# Patient Record
Sex: Female | Born: 1980
Health system: Southern US, Community
[De-identification: ages and names within clinical notes are randomized; demographics above are authoritative.]

## PROBLEM LIST (undated history)

## (undated) DIAGNOSIS — F32A Depression, unspecified: Secondary | ICD-10-CM

## (undated) DIAGNOSIS — R55 Syncope and collapse: Secondary | ICD-10-CM

## (undated) DIAGNOSIS — R011 Cardiac murmur, unspecified: Secondary | ICD-10-CM

## (undated) DIAGNOSIS — F329 Major depressive disorder, single episode, unspecified: Secondary | ICD-10-CM

## (undated) DIAGNOSIS — I10 Essential (primary) hypertension: Secondary | ICD-10-CM

## (undated) DIAGNOSIS — O24419 Gestational diabetes mellitus in pregnancy, unspecified control: Secondary | ICD-10-CM

## (undated) DIAGNOSIS — E611 Iron deficiency: Secondary | ICD-10-CM

## (undated) DIAGNOSIS — M069 Rheumatoid arthritis, unspecified: Secondary | ICD-10-CM

## (undated) HISTORY — PX: KNEE ARTHROPLASTY: SHX992

## (undated) HISTORY — DX: Cardiac murmur, unspecified: R01.1

## (undated) HISTORY — DX: Syncope and collapse: R55

## (undated) HISTORY — DX: Rheumatoid arthritis, unspecified: M06.9

## (undated) HISTORY — DX: Iron deficiency: E61.1

## (undated) HISTORY — DX: Essential (primary) hypertension: I10

## (undated) HISTORY — DX: Depression, unspecified: F32.A

## (undated) HISTORY — DX: Gestational diabetes mellitus in pregnancy, unspecified control: O24.419

---
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
## 2002-08-29 ENCOUNTER — Ambulatory Visit (HOSPITAL_BASED_OUTPATIENT_CLINIC_OR_DEPARTMENT_OTHER): Admission: RE | Admit: 2002-08-29 | Discharge: 2002-08-30 | Payer: Self-pay | Admitting: Specialist

## 2003-11-24 ENCOUNTER — Inpatient Hospital Stay (HOSPITAL_COMMUNITY): Admission: RE | Admit: 2003-11-24 | Discharge: 2003-11-26 | Payer: Self-pay | Admitting: Obstetrics & Gynecology

## 2004-06-11 ENCOUNTER — Ambulatory Visit (HOSPITAL_COMMUNITY): Admission: RE | Admit: 2004-06-11 | Discharge: 2004-06-11 | Payer: Self-pay | Admitting: Family Medicine

## 2004-07-22 ENCOUNTER — Encounter: Payer: Self-pay | Admitting: Orthopedic Surgery

## 2004-08-17 ENCOUNTER — Observation Stay (HOSPITAL_COMMUNITY): Admission: RE | Admit: 2004-08-17 | Discharge: 2004-08-18 | Payer: Self-pay | Admitting: Orthopedic Surgery

## 2004-08-17 ENCOUNTER — Encounter: Payer: Self-pay | Admitting: Orthopedic Surgery

## 2004-11-22 ENCOUNTER — Ambulatory Visit: Payer: Self-pay | Admitting: Orthopedic Surgery

## 2007-11-28 ENCOUNTER — Ambulatory Visit: Payer: Self-pay | Admitting: Oncology

## 2008-01-28 ENCOUNTER — Ambulatory Visit: Payer: Self-pay | Admitting: Oncology

## 2008-08-21 ENCOUNTER — Encounter: Payer: Self-pay | Admitting: Orthopedic Surgery

## 2011-01-25 NOTE — Letter (Signed)
Summary: [facility]erral No Show  [facility]  [address]  [place], [place] [address]   Phone: [phone]  Fax: [phone]    Dr. Lilyan Punt [address] [place], [place]  [address]    Re:    Cindy Henderson DOB:    09/28/1981    The above named patient was a no show for the scheduled appointment:  Date:  08/21/08 Time:  10:00  The patient had been a no show for 3 previous appointments.    Thank you for your referral.  Please feel free to contact our office if we can be of further service.  Sincerely,   [facility] of Dr. Terrance Mass, MD

## 2011-01-25 NOTE — Progress Notes (Signed)
Summary: Office Visit  Office Visit   Imported By: Jacklynn Ganong 01/08/2008 10:15:57  _____________________________________________________________________  External Attachment:    Type:   Image     Comment:   initial evaluation

## 2011-01-25 NOTE — Progress Notes (Signed)
Summary: Office Visit  Office Visit   Imported By: Jacklynn Ganong 01/08/2008 10:20:59  _____________________________________________________________________  External Attachment:    Type:   Image     Comment:   PROGRESS NOTE

## 2011-01-25 NOTE — Op Note (Signed)
Summary: Operative Report  Operative Report   Imported By: Jacklynn Ganong 01/08/2008 10:19:01  _____________________________________________________________________  External Attachment:    Type:   Image     Comment:   OTIF RIGHT TIBIAL TUBERCLE

## 2011-05-13 NOTE — H&P (Signed)
NAME:  Cindy Henderson                             ACCOUNT NO.:  [id number]   MEDICAL RECORD NO.:  [id number]                   PATIENT TYPE:  AMB   LOCATION:  DAY                                  FACILITY:  [facility]   PHYSICIAN:  Vickki Hearing, M.D.           DATE OF BIRTH:  Aug 17, 1981   DATE OF ADMISSION:  DATE OF DISCHARGE:                                HISTORY & PHYSICAL   CHIEF COMPLAINT:  Right knee pain.   HISTORY OF PRESENT ILLNESS:  Cindy Henderson is 30 years old. She is status post a  right and left extensor mechanism realignment, both proximal and distal with  Dr. Ronnell Guadalajara in [place] who is now retired. The left knee did well;  the right knee did not. She had a revision of an initial stapling of the  tibial tubercle with insertion of 6.5 cancellous screw with bone grafting  followed by electrical stimulation. Apparently, this did not help because  she is having protrusion of the screw with pain and giving way of the right  knee.   Workup included radiographs which showed the screw is prominent, and there  is a radiolucent line behind the tibial tubercle, and looks to be detached  from the tibial. The screw head is tender, but also medial and lateral to  the tubercle along the radiolucent line there is tenderness.   PHYSICAL EXAMINATION:  Weight 122, pulse 78, respiratory rate 17. She is  well developed and nourished. Grooming and hygiene are normal. There are no  deformities other than the knee, and she has normal pulses. Extremities were  warm. No edema. No tenderness. No swelling or varicosities. Lymph nodes are  negative. Gait and station appear to be normal. Heel to toe gait with a  slight favoring of the right lower extremity.   There is tenderness along the medial and lateral aspects of the tibial  tubercle as well as the prominent screw head. Her range of motion continues  to be full. There is apprehensive and subluxation of the patella,  approximately 50 to  75% of its width which may or may not be normal.  Quadriceps muscles appear to be normal in terms of strength with a slight  hint of atrophy on the right side. There is a scar; the scar itself is  nontender over the right knee. Sensation remains intact. She has normal  reflexes and coordination. She is alert and oriented x3. No depression,  anxiety, or agitation are noted.   Intraoperative x-rays do show that there is a radiolucent line along the  screw with prominence of the screw head. It is a 6.5 cancellous screw.   ASSESSMENT/PLAN:  As I indicated to Elbert, this is a very difficult case.  There may be no solution for this case. My plan is to take to screw out,  evaluate the tibial tubercle site; if necessary curet it, stimulate it with  multiple drill holes, bone graft it with autogenous graft from the Gerdy's  tubercle area, and use a secondary healing agent such as  bone putty plus or minus cancellous chips with or without Dahl-Miles cables.  If possible, another screw will be placed although I doubt this based on the  appearance.   She understands that this may not work, and she may be left with long term  dysfunction of the right knee.     ___________________________________________                                         Vickki Hearing, M.D.   SEH/MEDQ  D:  08/16/2004  T:  08/16/2004  Job:  [id number]

## 2011-05-13 NOTE — Op Note (Signed)
NAME:  Cindy Henderson, Cindy Henderson                             ACCOUNT NO.:  [id number]   MEDICAL RECORD NO.:  [id number]                   PATIENT TYPE:  AMB   LOCATION:  DAY                                  FACILITY:  [facility]   PHYSICIAN:  Vickki Hearing, M.D.           DATE OF BIRTH:  [birth year]   DATE OF PROCEDURE:  08/17/2004  DATE OF DISCHARGE:                                 OPERATIVE REPORT   PREOPERATIVE DIAGNOSIS:  Right tibial tubercle nonunion.   POSTOPERATIVE DIAGNOSIS:  Right tibial tubercle nonunion.   PROCEDURE:  Open treatment, internal fixation, right tibial tubercle with  DBX bone putty 5 cc and autogenous bone graft from the right tibia and  Gerdy's tubercle with application of long leg cast.   OPERATIVE FINDINGS:  Fibrous nonunion right tibial tubercle. We also removed  hardware from previous surgery. I did insert a 4.5 cannulated Synthes screw  and washer.   SURGEON:  Vickki Hearing, M.D.   ANESTHESIA:  General.   TOURNIQUET TIME:  One hour 15 minutes.   DRAINS:  There was 1 Hemovac drain placed.   Sponge and needle count were correct.   HISTORY:  Cindy Henderson is 30 years old. She had an extensor mechanism  realignment by Dr. Ronnell Guadalajara in [place]. The knee did well except  the initial sampling of the tibial tubercle backed out causing a nonunion.  This was treated with 6.5 cancellous screw and bone grafting with electrical  stimulator, but this did not help as well. The patient came to me when Dr.  Montez Morita retired. Workup showed an x-ray with radiolucent alignment behind the  tibial tubercle showing what appeared to be detachment.   DESCRIPTION OF PROCEDURE:  Cindy Henderson was identified in the preop holding  area. Her right leg was marked for surgery. She was given Ancef and taken to  the operating room for general anesthesia. We took a mandatory time out and  confirmed our patient's identity, procedure, and extremity. Everyone in the  OR agreed, and we  proceeded with sterile prep and drape and elevated the  right leg, exsanguinated with an Esmarch, elevated the tourniquet at 300  mmHg. I used the same incision and divided the subcu tissue until I got down  to the patellar tendon and the screw. I tested the tibial tubercle site, and  there was gross motion. I removed the screw, carried out the dissection  until I could foot the patellar tendon and tubercle superior. The bed was  sclerotic, and this was treated by drilling multiple drill holes to  stimulate bleeding as well as burring the area to also stimulate bleeding  from the cancellous surface. I pinned the tubercle back in place and checked  the C-arm picture to assure its position and then drilled a K wire in a  different spot on the tubercle under C-arm guidance until it reached the  posterior cortex.  With careful technique and C arm, I went through the  cortex and then passed a 52-mm screw and washer, 4.5 cannulated screw.  Behind the tubercle was bone graft which I obtained from Gerdy's tubercle  through a cortical window along with DBX putty which was mixed together. I  passed a #5 Ethibond suture in a figure-of-eight  fashion to provide a  tension band and secured this with the screw and washer as well. This gave  an excellent bite posteriorly. This also gave excellent compression at the  tibial tubercle site. I flexed the knee up to 125 degrees with no problem.  The patella engaged the trochlea at 30 degrees of flexion. We irrigated the  knee and closed with 2-0 Vicryl staples and sterile strips. I injected 30 cc  of Marcaine. I placed her in a long leg cast after the sterile dressings  were applied. There was a Hemovac drain in place as well.   The patient was extubated, taken to the recovery room in stable condition.  She should be able to go home unless she is having a lot of pain at which  point we will observe her for pain control. She is non weight bearing for 6  weeks.  I will follow up in 2 days to remove the drain and at 10 days we will  take the staples out and put more steri strips down. I expect she will wear  the cast for 6 weeks. We will take x-rays at first cast removal and then at  2 week intervals.      ___________________________________________                                            Vickki Hearing, M.D.   SEH/MEDQ  D:  08/17/2004  T:  08/17/2004  Job:  [id number]

## 2011-05-13 NOTE — Discharge Summary (Signed)
Cindy [facility], Cindy Henderson                             ACCOUNT NO.:  [id number]   MEDICAL RECORD NO.:  [id number]                   PATIENT TYPE:  OBV   LOCATION:  A331                                 FACILITY:  [facility]   PHYSICIAN:  Vickki Hearing, M.D.           DATE OF BIRTH:  12-30-1980   DATE OF ADMISSION:  08/17/2004  DATE OF DISCHARGE:  08/18/2004                                 DISCHARGE SUMMARY   ADMISSION DIAGNOSIS:  Nonunion, right tibial tubercle.   DISCHARGE DIAGNOSIS:  Nonunion, right tibial tubercle.   PROCEDURE:  Open treatment and internal fixation with autogenous bone graft,  right tibial tubercle, with application of long leg cast.   SURGEON:  Vickki Hearing, M.D.   ANESTHESIA:  General.   FINDINGS:  Nonunion, right tibial tubercle.   HISTORY:  [name] is 30 years old.  She had two right knee procedures by  Dr. Ronnell Guadalajara in [place].  The first was for patellar subluxation  and dislocation.  She had proximal and distal realignment of the extensor  mechanism treated with staple fixation.  The fixation failed.  She went back  for a second procedure to have open treatment and internal fixation with 6.5  cancellous lag screw and bone graft followed by electrical stimulation.  This also failed to heal, and she presented to my office complaining of pain  at the tibial tubercle site and an x-ray which showed radiolucency at that  area.   HOSPITAL COURSE:  The patient was admitted on 08/17/2004.  She underwent an  uncomplicated open treatment and internal fixation of the right tibial  tubercle using a 4.5 screw and a 0.045 K-wire with autogenous bone graft  from Gerdy's tubercle and DBX bone putty.   She was admitted because of pain control issues.  She was treated for that  with morphine and Tylox.  She did well and was discharged on the 24th in  stable condition, ambulating with crutches, nonweightbearing.  She had a  Hemovac drain still in place.  She was  to follow up on the 25th for drain  removal and x-rays in the office.   DISPOSITION:  Home.   CONDITION ON DISCHARGE:  Improved.   DISCHARGE MEDICATIONS:  Percocet 5/325 one q.4h. p.r.n. pain.    ___________________________________________                                         Vickki Hearing, M.D.   SEH/MEDQ  D:  08/18/2004  T:  08/18/2004  Job:  [id number]

## 2012-03-21 ENCOUNTER — Ambulatory Visit (INDEPENDENT_AMBULATORY_CARE_PROVIDER_SITE_OTHER): Payer: Self-pay | Admitting: Orthopedic Surgery

## 2012-03-21 ENCOUNTER — Encounter: Payer: Self-pay | Admitting: Orthopedic Surgery

## 2012-03-21 VITALS — BP 120/80 | Ht 67.0 in | Wt 153.0 lb

## 2012-03-21 DIAGNOSIS — M25869 Other specified joint disorders, unspecified knee: Secondary | ICD-10-CM

## 2012-03-21 DIAGNOSIS — M25361 Other instability, right knee: Secondary | ICD-10-CM | POA: Insufficient documentation

## 2012-03-21 NOTE — Progress Notes (Signed)
  Subjective:    Cindy Henderson is a 31 y.o. female who presents with a right knee problem  involving the right knee. The patient presented to me back in 2005 with a nonunion of the tibial tubercle transfer for recurrent dislocations of the patella.  He started having issues at age 65 his and has had 6 or 7 procedures on the RIGHT knee.  Approximately 1-2 years ago the knee started to re-dislocated after a traumatic injury.  She comes in complaining of sharp throbbing 8/10 constant pain associated with numbness and tingling around the incision with catching and redislocation of the patella.  Review of systems she reported weight gain and fatigue, redness and watering of the eyes, heart murmur, cough and tightness of the chest with snoring, nausea and diarrhea, frequency and urgency.  Poor healing of the skin, dizziness, nervousness anxiety and depression with easy bleeding and bruising.  The following portions of the patient's history were reviewed and updated as appropriate: allergies, current medications, past family history, past medical history, past social history, past surgical history and problem list.   Review of Systems Pertinent items are noted in HPI.   Objective:    BP 120/80  Ht 5\' 7"  (1.702 m)  Wt 153 lb (69.4 kg)  BMI 23.96 kg/m2  Vital signs are stable as recorded  General appearance is normal, normal height - weight proportion   The patient is alert and oriented x3  The patient's mood and affect are normal  Gait assessment: favors the right leg   The cardiovascular exam reveals normal pulses and temperature without edema swelling.  The lymphatic system is negative for palpable lymph nodes  The sensory exam is normal.  There are no pathologic reflexes.  Balance is normal.  Skin:  she has 2 incisions one over both knees the one on the RIGHT is well healed there is some sensitivity over the K wire which is part of the fixation of the tibial tubercle revision.    The  RIGHT knee has full range of motion there is no joint effusion there is swelling around the distal portion of this incision near the tibial tubercle which appears to be fibrosis. There is significant apprehension of the patella at 0 and 20 flexion.  There is slight increased translation laterally more than 50% of the patella with the knee in extension but this decreases to less than 25% with the knee at 20 flexion.  The tracking seemed to be normal but the patient is very apprehensive and she reports hypersensitivity to touch around the knee joint.         X-ray right knee: x-ray from [facility] 4 views of the RIGHT knee tibial tubercle transfer ReVision healed.  There is a K wire in place and a screw which compared to the x-ray taken in 2005 shows no change in position although the patient feels that the screw is backing out.  The K wire is still in place as well although on exam it is sensitive when this area is tapped.   Assessment:    Right knees subluxating patella despite 6 surgeries.    Plan:    referral [facility] for redislocation of patella

## 2012-03-21 NOTE — Patient Instructions (Signed)
Pick up brace from [facility]

## 2012-06-13 ENCOUNTER — Other Ambulatory Visit (HOSPITAL_COMMUNITY): Payer: Self-pay | Admitting: Nurse Practitioner

## 2012-06-13 DIAGNOSIS — N63 Unspecified lump in unspecified breast: Secondary | ICD-10-CM

## 2012-06-27 ENCOUNTER — Ambulatory Visit (HOSPITAL_COMMUNITY)
Admission: RE | Admit: 2012-06-27 | Discharge: 2012-06-27 | Disposition: A | Discharge status: Home / Self Care | Payer: PRIVATE HEALTH INSURANCE | Source: Ambulatory Visit | Attending: Nurse Practitioner | Admitting: Nurse Practitioner

## 2012-06-27 DIAGNOSIS — N63 Unspecified lump in unspecified breast: Secondary | ICD-10-CM

## 2012-06-27 DIAGNOSIS — Z803 Family history of malignant neoplasm of breast: Secondary | ICD-10-CM | POA: Insufficient documentation

## 2012-07-03 ENCOUNTER — Other Ambulatory Visit (HOSPITAL_COMMUNITY): Payer: Self-pay | Admitting: General Surgery

## 2012-07-03 DIAGNOSIS — N6009 Solitary cyst of unspecified breast: Secondary | ICD-10-CM

## 2012-07-04 ENCOUNTER — Ambulatory Visit (HOSPITAL_COMMUNITY)
Admission: RE | Admit: 2012-07-04 | Discharge: 2012-07-04 | Disposition: A | Discharge status: Home / Self Care | Payer: PRIVATE HEALTH INSURANCE | Source: Ambulatory Visit | Attending: General Surgery | Admitting: General Surgery

## 2012-07-04 ENCOUNTER — Inpatient Hospital Stay (HOSPITAL_COMMUNITY): Admission: RE | Admit: 2012-07-04 | Discharge: 2012-07-04 | Payer: Self-pay | Source: Ambulatory Visit

## 2012-07-04 ENCOUNTER — Other Ambulatory Visit (HOSPITAL_COMMUNITY): Payer: Self-pay | Admitting: General Surgery

## 2012-07-04 DIAGNOSIS — Z803 Family history of malignant neoplasm of breast: Secondary | ICD-10-CM | POA: Insufficient documentation

## 2012-07-04 DIAGNOSIS — N6009 Solitary cyst of unspecified breast: Secondary | ICD-10-CM

## 2012-07-04 NOTE — Progress Notes (Signed)
1125 Procedure for Left Breast Aspiration started.  3 cc of 2 % Lidocaine injected into left breast.  10 cc of clear yellow fluid removed from left breast.  Pt tolerated procedure well.  Pt recommended for mammogram as soon as possible for family history. Procedure end time 1135.

## 2012-07-04 NOTE — Progress Notes (Signed)
Pt denies being on any anti-coagulants.

## 2012-07-05 ENCOUNTER — Other Ambulatory Visit (HOSPITAL_COMMUNITY): Payer: Self-pay

## 2012-07-17 ENCOUNTER — Other Ambulatory Visit (HOSPITAL_COMMUNITY)
Admission: RE | Admit: 2012-07-17 | Discharge: 2012-07-17 | Disposition: A | Discharge status: Home / Self Care | Payer: Self-pay | Source: Ambulatory Visit | Attending: Unknown Physician Specialty | Admitting: Unknown Physician Specialty

## 2012-07-17 ENCOUNTER — Other Ambulatory Visit: Payer: Self-pay | Admitting: Nurse Practitioner

## 2012-07-17 DIAGNOSIS — R87611 Atypical squamous cells cannot exclude high grade squamous intraepithelial lesion on cytologic smear of cervix (ASC-H): Secondary | ICD-10-CM | POA: Insufficient documentation

## 2012-07-17 DIAGNOSIS — N879 Dysplasia of cervix uteri, unspecified: Secondary | ICD-10-CM | POA: Insufficient documentation

## 2013-09-14 IMAGING — US US BREAST*L*
1 series · 6 of 6 positions shown · non-contrast
Comparison: None.

CLINICAL DATA: 30-year-old patient with palpable mass in the upper
outer quadrant of the left breast.  She states that the mass has
increased in size over the past year, and is mobile.  She reports
that her mother has a history of breast cancer, diagnosed in her
thirties, and her grandmother has a history of breast cancer.

LEFT BREAST ULTRASOUND

[Series 1: us breast*left* · 0.08mm/px · 6 of 6 slices shown]
[im 1/6]
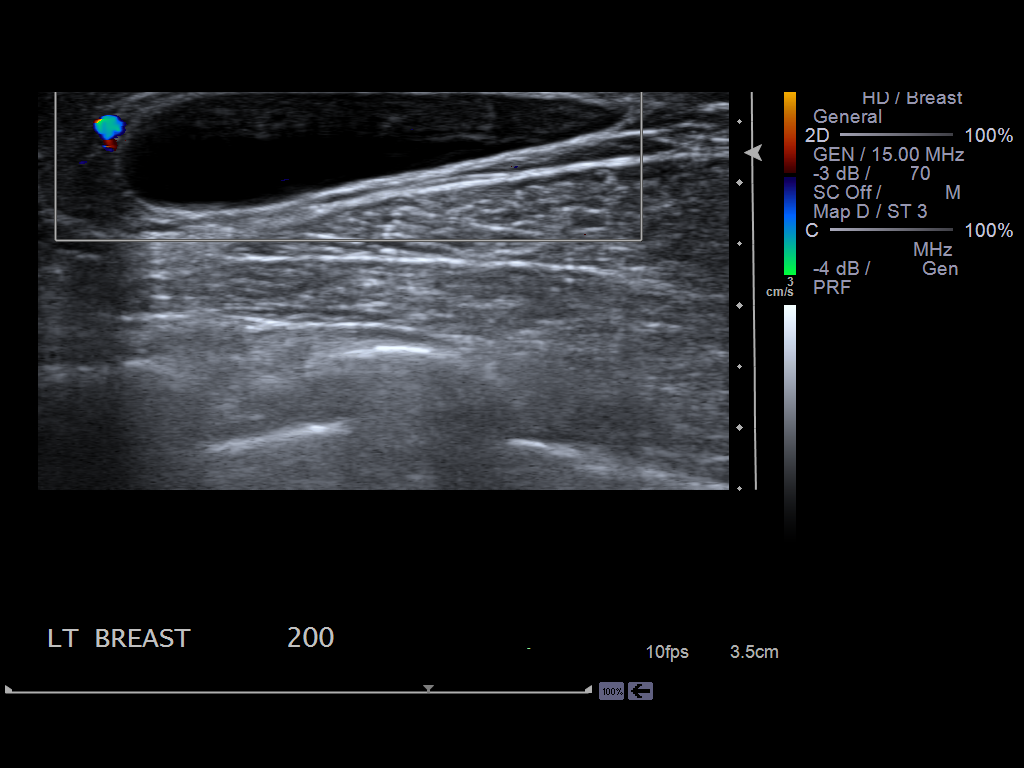
[im 2/6]
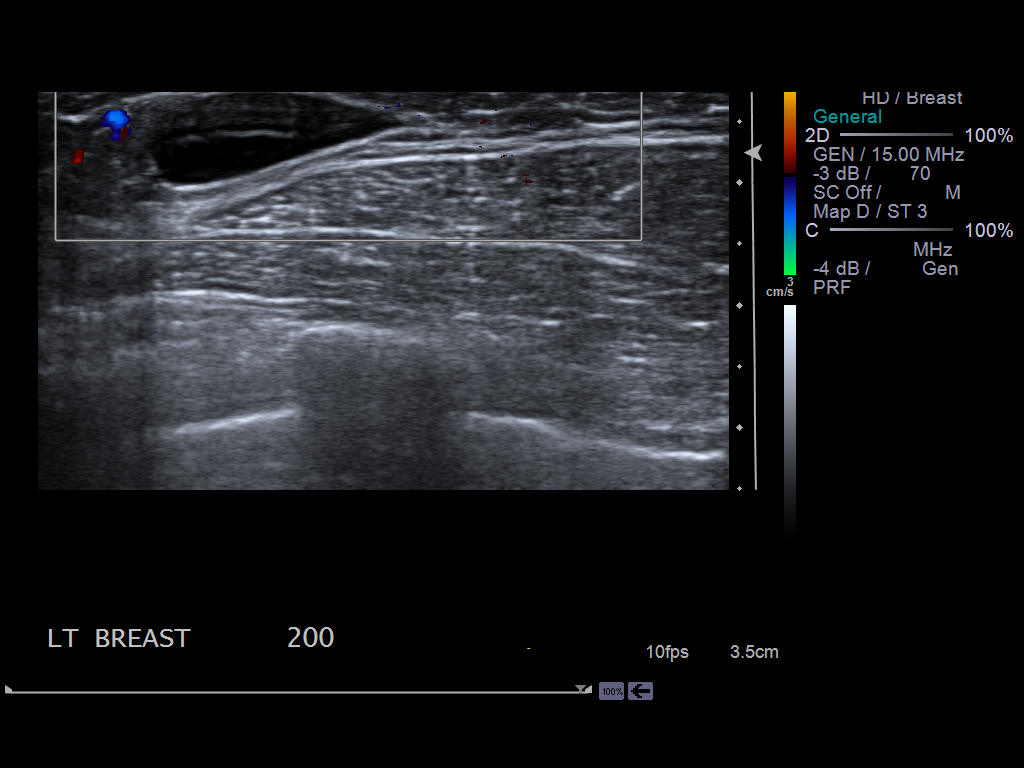
[im 3/6]
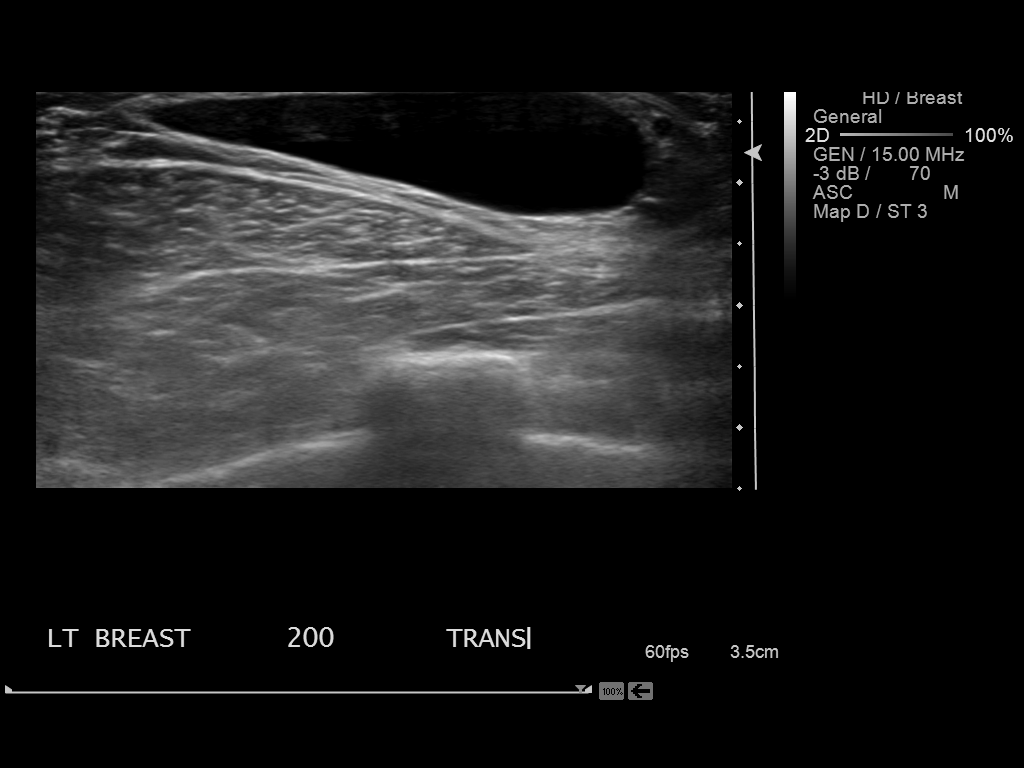
[im 4/6]
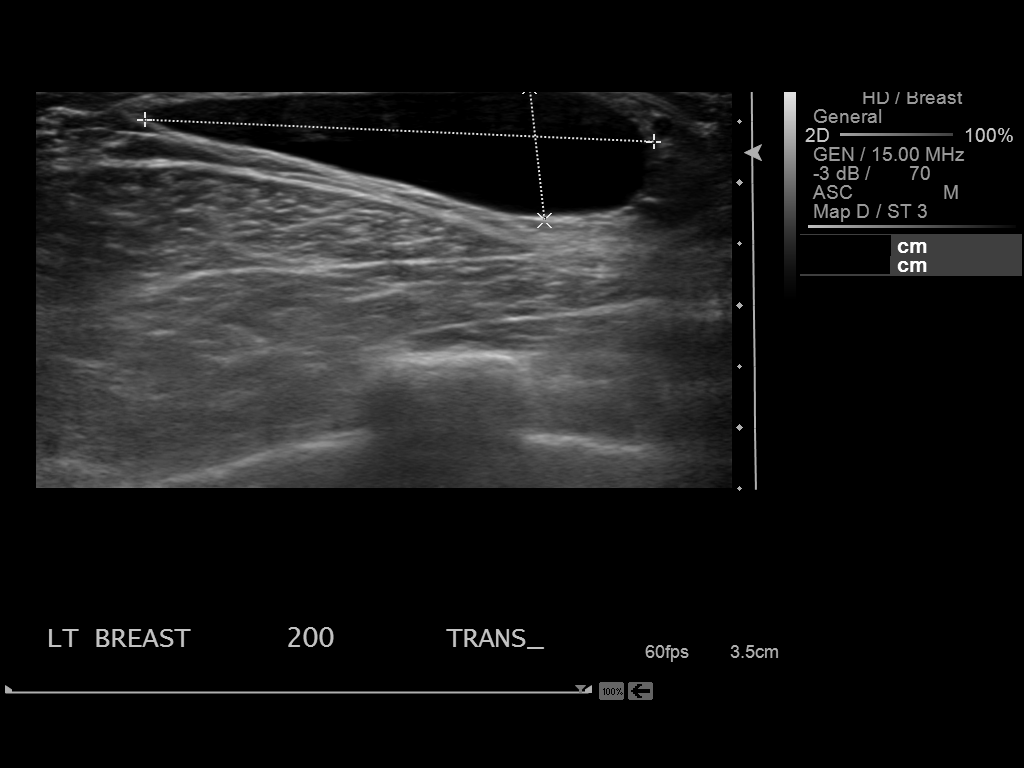
[im 5/6]
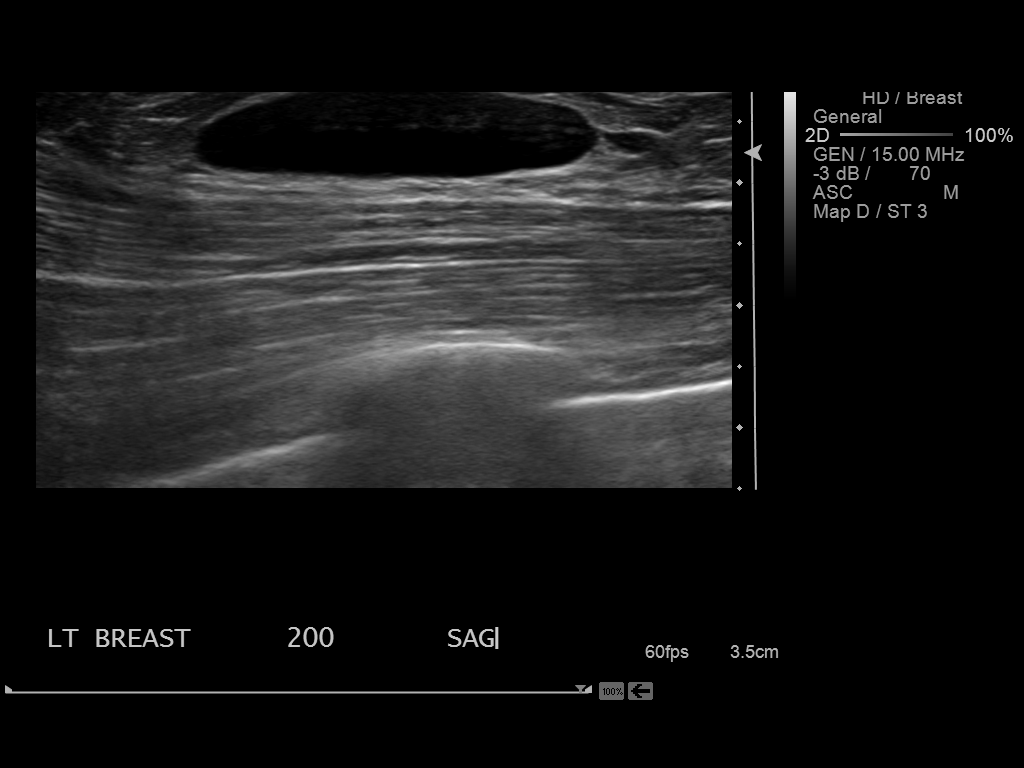
[im 6/6]
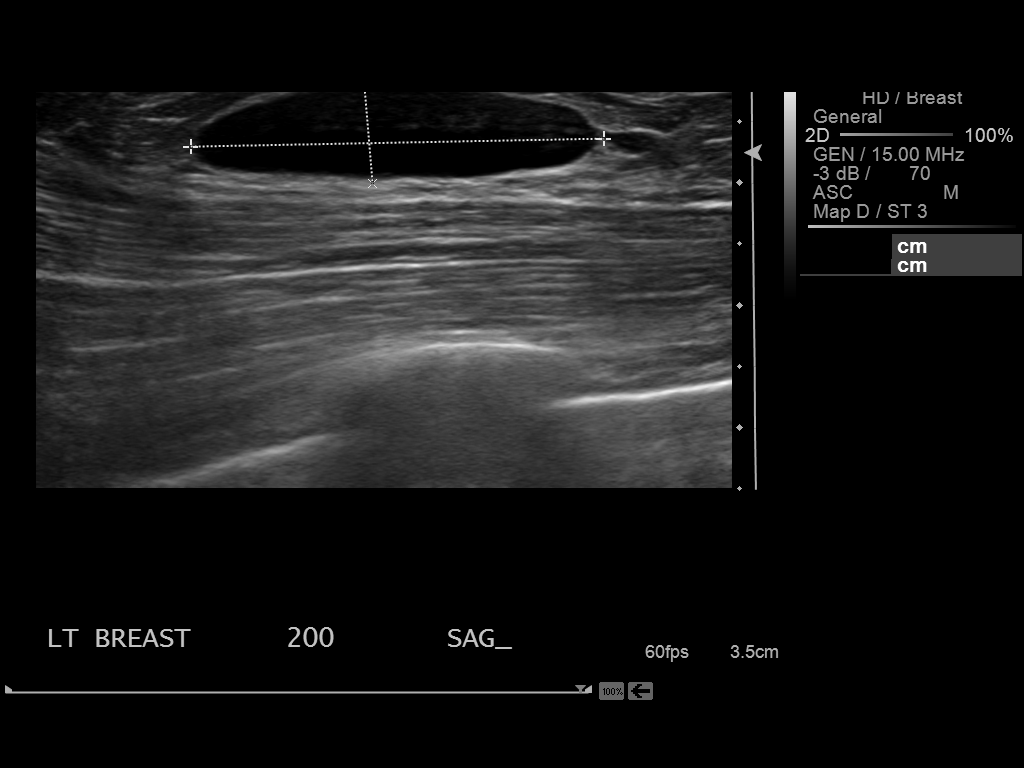

[6 of 6 positions shown; findings below may reference images not displayed]

On physical exam, there is a soft oblong 4 cm palpable mobile mass
2 o'clock position left breast 5 cm from the nipple.
FINDINGS: Ultrasound is performed, showing an oblong simple cyst
with a thin wall that measures 4.2 x 1.1 x 3.4 cm.  There is no
internal color Doppler flow.
IMPRESSION: A simple cyst 2 o'clock position left breast accounts for the
palpable area of concern.  No evidence of malignancy.  Findings
were discussed with the patient.  She reports that she is
interested in having this cyst aspirated.

RECOMMENDATION:
The patient has surgical consultation scheduled through [REDACTED].
Cyst aspiration could be performed, as the patient requests it, and
this could be done with ultrasound guidance in our radiology
department, if desired.  Given the patient's reported family
history of breast cancer, diagnosed in her mother in her thirties,
annual bilateral screening mammogram is recommended, beginning 10
years of age prior to the mother's age at diagnosis; (the patient
did not state the specific age at her mother's diagnosis today).

BI-RADS CATEGORY 2:  Benign finding(s).

## 2013-09-21 IMAGING — US US GUIDANCE NEEDLE PLACEMENT
1 series · 3 of 3 positions shown · non-contrast
Comparison: Priors

CLINICAL DATA: 30-year-old female with a painful left breast cyst.
The patient request aspiration.

ULTRASOUND GUIDED ASPIRATION OF THE left BREAST

[Series 1: us guidance needle placement · 0.08mm/px · 3 of 3 slices shown]
[im 1/3]
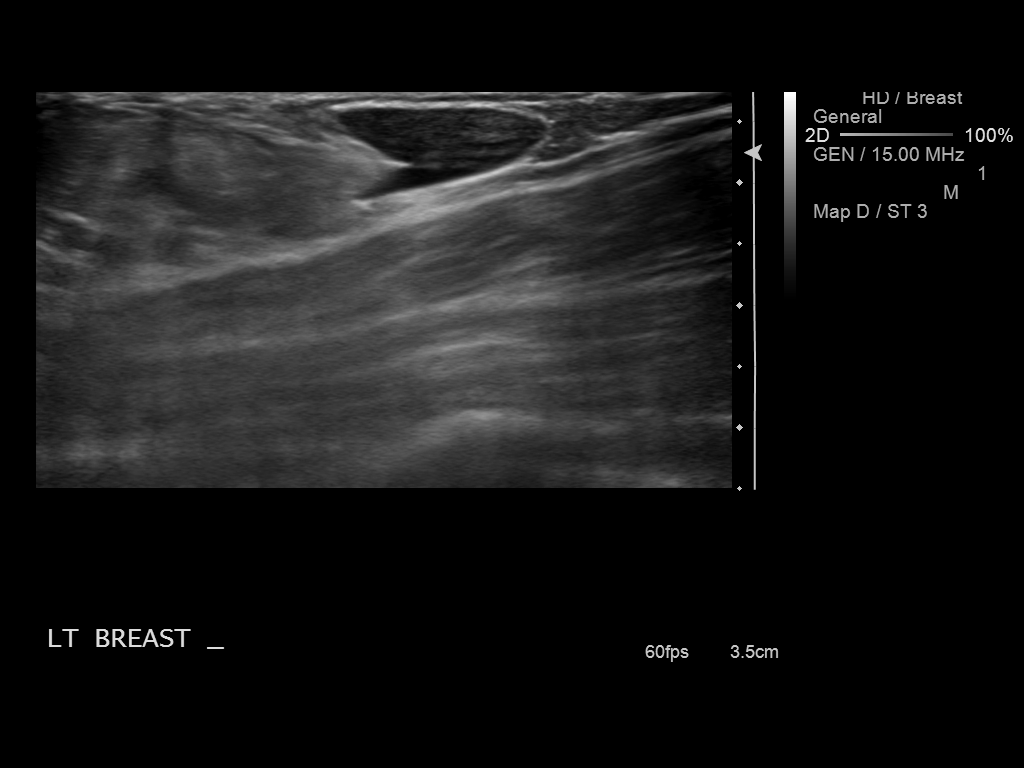
[im 2/3]
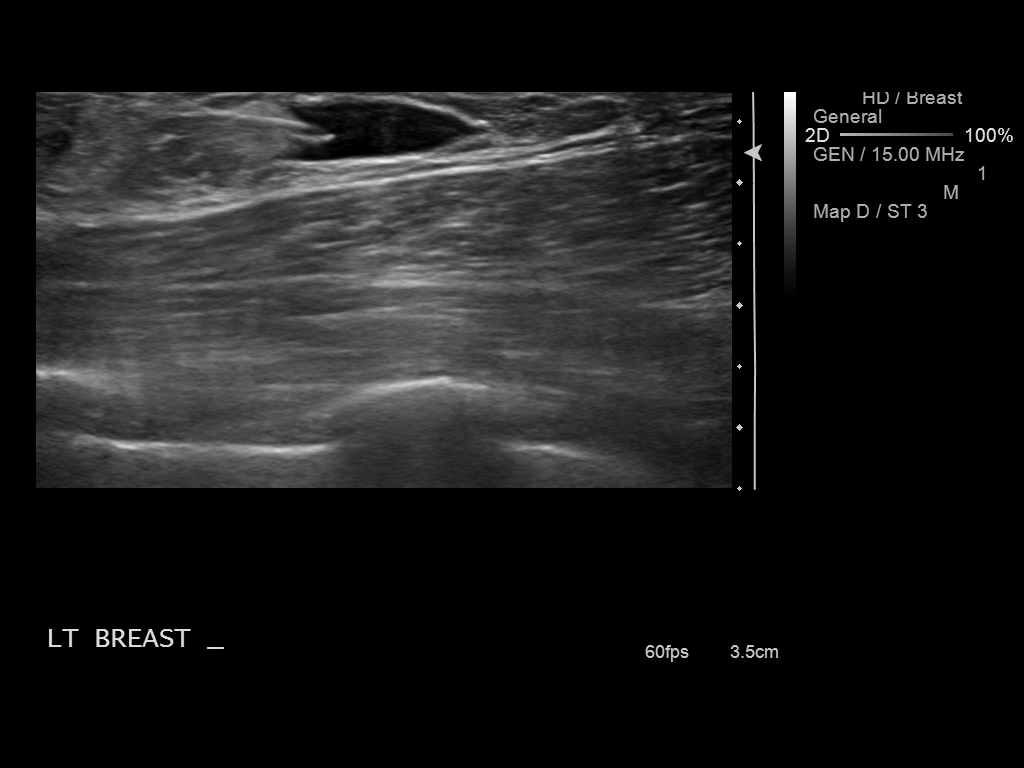
[im 3/3]
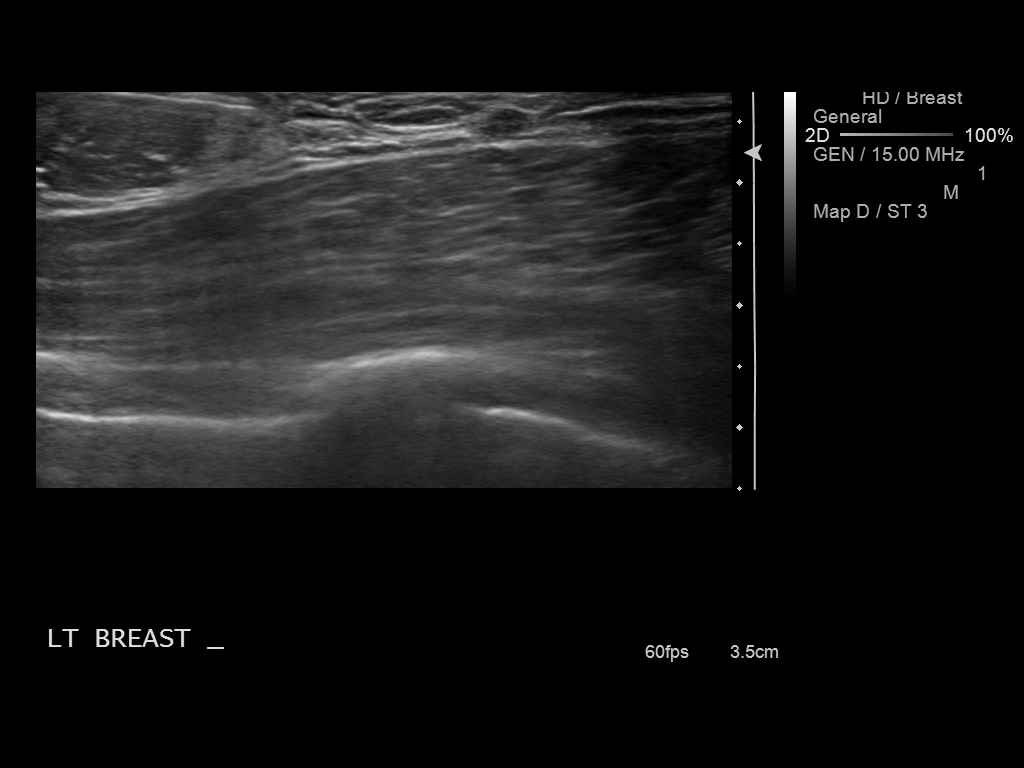

[3 of 3 positions shown; findings below may reference images not displayed]

Using sterile technique,  2% lidocaine, ultrasound guidance, and an
18 gauge needle, aspiration was performed of a cyst in the 2
o'clock region of the left breast.  Approximately 10 ml of clear
yellow fluid was aspirated from the cyst which decompressed.  The
fluid was not sent to cytology since it was consistent with
fibrocystic fluid.
IMPRESSION: Ultrasound-guided aspiration of the left breast.  No apparent
complications.

The patients mother was diagnosed with breast cancer at the age of
32.   Screening mammography is recommended  10 years prior to a
first-degree relatives diagnosis of breast cancer.   It is
recommended that the patient start screening mammography.  This was
discussed with the patient.  The patient states that she will have
the study ordered through the [facility].

## 2014-04-17 ENCOUNTER — Encounter (HOSPITAL_COMMUNITY): Payer: Self-pay | Admitting: Emergency Medicine

## 2014-04-17 ENCOUNTER — Emergency Department (HOSPITAL_COMMUNITY)
Admission: EM | Admit: 2014-04-17 | Discharge: 2014-04-17 | Disposition: A | Discharge status: Home / Self Care | Payer: Self-pay | Attending: Emergency Medicine | Admitting: Emergency Medicine

## 2014-04-17 ENCOUNTER — Emergency Department (HOSPITAL_COMMUNITY): Payer: Self-pay

## 2014-04-17 DIAGNOSIS — Z862 Personal history of diseases of the blood and blood-forming organs and certain disorders involving the immune mechanism: Secondary | ICD-10-CM | POA: Insufficient documentation

## 2014-04-17 DIAGNOSIS — Y9361 Activity, american tackle football: Secondary | ICD-10-CM | POA: Insufficient documentation

## 2014-04-17 DIAGNOSIS — Z7982 Long term (current) use of aspirin: Secondary | ICD-10-CM | POA: Insufficient documentation

## 2014-04-17 DIAGNOSIS — F17200 Nicotine dependence, unspecified, uncomplicated: Secondary | ICD-10-CM | POA: Insufficient documentation

## 2014-04-17 DIAGNOSIS — Y92838 Other recreation area as the place of occurrence of the external cause: Secondary | ICD-10-CM

## 2014-04-17 DIAGNOSIS — R011 Cardiac murmur, unspecified: Secondary | ICD-10-CM | POA: Insufficient documentation

## 2014-04-17 DIAGNOSIS — Y9239 Other specified sports and athletic area as the place of occurrence of the external cause: Secondary | ICD-10-CM | POA: Insufficient documentation

## 2014-04-17 DIAGNOSIS — S8991XA Unspecified injury of right lower leg, initial encounter: Secondary | ICD-10-CM

## 2014-04-17 DIAGNOSIS — Z8632 Personal history of gestational diabetes: Secondary | ICD-10-CM | POA: Insufficient documentation

## 2014-04-17 DIAGNOSIS — X500XXA Overexertion from strenuous movement or load, initial encounter: Secondary | ICD-10-CM | POA: Insufficient documentation

## 2014-04-17 DIAGNOSIS — S8000XA Contusion of unspecified knee, initial encounter: Secondary | ICD-10-CM | POA: Insufficient documentation

## 2014-04-17 MED ORDER — HYDROCODONE-ACETAMINOPHEN 5-325 MG PO TABS: 1.0000 | ORAL_TABLET | ORAL | Status: DC | PRN

## 2014-04-17 NOTE — ED Notes (Signed)
Twisted right knee 2 weeks ago.  States she has a history of several surgeries to the same knee.

## 2014-04-17 NOTE — ED Provider Notes (Signed)
CSN: [id number]     Arrival date & time 04/17/14  0908 History   First MD Initiated Contact with Patient 04/17/14 [phone]     Chief Complaint  Patient presents with  . Knee Pain     (Consider location/radiation/quality/duration/timing/severity/associated sxs/prior Treatment) Patient is a 33 y.o. female presenting with knee pain. The history is provided by the patient.  Knee Pain Location:  Knee Time since incident:  2 weeks Injury: yes   Mechanism of injury comment:  Sports Knee location:  R knee Pain details:    Quality:  Sharp   Radiates to:  Does not radiate   Severity:  Severe   Onset quality:  Sudden   Duration:  2 weeks   Timing:  Constant   Progression:  Unchanged Chronicity:  New Dislocation: no   Foreign body present:  No foreign bodies Prior injury to area:  Yes Relieved by:  Nothing Worsened by:  Bearing weight, activity, extension and flexion Ineffective treatments:  Acetaminophen (goody powder) Associated symptoms: decreased ROM, stiffness and swelling   Associated symptoms: no back pain, no fever, no itching and no tingling    Jakylah R Philley is a 33 y.o. female who presents to the ED with right knee pain that started 2 weeks ago after she was playing foot ball with her boys. She got a knee brace to wear but has continued to have pain. She has had surgery x 6 on the same knee. Discussion of knee replacement but has not been done. The pain is over the medial aspect.  Past Medical History  Diagnosis Date  . Heart murmur   . Diabetes, gestational   . Low iron    History reviewed. No pertinent past surgical history. Family History  Problem Relation Age of Onset  . Heart disease    . Arthritis    . Cancer    . Asthma    . Diabetes     History  Substance Use Topics  . Smoking status: Current Every Day Smoker  . Smokeless tobacco: Not on file  . Alcohol Use: Yes     Comment: OCCASIONAL   OB History   Grav Para Term Preterm Abortions TAB SAB Ect Mult Living                   Review of Systems  Constitutional: Negative for fever and chills.  HENT: Negative.   Eyes: Negative for visual disturbance.  Respiratory: Negative for cough, shortness of breath and wheezing.   Cardiovascular: Negative for chest pain.  Gastrointestinal: Negative for nausea, vomiting and abdominal pain.  Genitourinary: Negative for dysuria, urgency and frequency.  Musculoskeletal: Positive for stiffness. Negative for back pain.       Right knee pain  Skin: Negative for itching and wound.  Neurological: Negative for light-headedness and headaches.  Psychiatric/Behavioral: Negative for confusion. The patient is not nervous/anxious.       Allergies  Ibuprofen  Home Medications   Prior to Admission medications   Medication Sig Start Date End Date Taking? Authorizing Provider  Aspirin-Acetaminophen (GOODYS BODY PAIN PO) Take 1 packet by mouth every 6 (six) hours as needed (knee pain).   Yes Historical Provider, MD   BP 155/84  Pulse 87  Temp(Src) 97.5 F (36.4 C) (Oral)  Resp 18  Ht 5\' 7"  (1.702 m)  Wt 145 lb (65.772 kg)  BMI 22.71 kg/m2  SpO2 100% Physical Exam  Nursing note and vitals reviewed. Constitutional: She is oriented to person, place, and  time. She appears well-developed and well-nourished.  HENT:  Head: Normocephalic and atraumatic.  Eyes: Conjunctivae and EOM are normal.  Neck: Neck supple.  Cardiovascular: Normal rate.   Pulmonary/Chest: Effort normal.  Abdominal: Soft. There is no tenderness.  Musculoskeletal:       Right knee: She exhibits decreased range of motion and swelling. She exhibits no deformity, no laceration, no erythema, normal alignment and normal patellar mobility. Tenderness found. MCL tenderness noted.       Legs: Bruising noted to the right knee. Pain over the MCL with range of motion. Difficulty with flexion and extension due to pain. Pedal pulses equal, adequate circulation, good touch sensation.   Neurological: She is  alert and oriented to person, place, and time. No cranial nerve deficit.  Skin: Skin is warm and dry.  Psychiatric: She has a normal mood and affect. Her behavior is normal.    ED Course  Procedures (including critical care time) Labs Review Labs Reviewed - No data to display  Imaging Review Dg Knee Complete 4 Views Right  04/17/2014   CLINICAL DATA:  Medial right knee pain for 2 weeks. Prior knee surgeries  EXAM: RIGHT KNEE - COMPLETE 4+ VIEW  COMPARISON:  08/17/2004; report from 02/17/2012  FINDINGS: Cannulated lag screw and K-pin fixation of bony material along the tibial tubercle, with excellent bony union.  Mild spurring of the tibial spine and mild marginal spurring in the medial compartment. Mild patellofemoral spurring.  The distal patellar tendon is thickened.  Trace knee effusion.  IMPRESSION: 1. Thickened distal patellar tendon. Some of this may be chronic given the postoperative findings along the tibial tubercle. Solid bony fusion at the tibial tubercle noted. 2. Trace knee effusion.   Electronically Signed   By: Herbie Baltimore M.D.   On: 04/17/2014 09:55     MDM  33 y.o. female with right knee pain s/p injury. She has a knee immobilizer that she will continue to wear and follow up with ortho. Will treat pain, she will ice and elevate the area. I have reviewed this patient's vital signs, nurses notes, appropriate labs and imaging.  I have discussed findings with the patient and plan of care. She voices understanding. Stable for discharge without neurovascular deficits.    Medication List    TAKE these medications       HYDROcodone-acetaminophen 5-325 MG per tablet  Commonly known as:  NORCO/VICODIN  Take 1 tablet by mouth every 4 (four) hours as needed.      ASK your doctor about these medications       GOODYS BODY PAIN PO  Take 1 packet by mouth every 6 (six) hours as needed (knee pain).           [facility] Orlene Och, [place] 04/17/14 1704

## 2014-04-17 NOTE — Discharge Instructions (Signed)
Do not take the narcotic if you are driving as it will make you sleepy. Continue to wear your knee brace for support. Follow up with Dr. Hilda Lias, return here as needed.

## 2014-04-19 NOTE — ED Provider Notes (Signed)
Medical screening examination/treatment/procedure(s) were performed by non-physician practitioner and as supervising physician I was immediately available for consultation/collaboration.   EKG Interpretation None       Donnetta Hutching, MD 04/19/14 [phone]

## 2018-07-27 NOTE — Progress Notes (Signed)
Office Visit Note  Patient: Cindy Henderson             Date of Birth: Dec 22, 1981           MRN: [id number]             PCP: Denny Levy, PA Referring: Denny Levy, [place] Visit Date: 08/09/2018 Occupation: machinist  Subjective:  Pain and swelling in multiple joints.   History of Present Illness: Cindy Henderson is a 37 y.o. female seen in consultation per request of her PCP.  According to patient her symptoms started about a year ago with increased pain in her hands and swelling.  She works as a Furniture conservator/restorer and she related her symptoms were job-related.  About 2 months ago she noticed the swelling got worse to the point she was having difficulty making a fist.  She also started having pain in her other joints.  She describes in her left shoulder, right elbow, bilateral wrist, bilateral hands, bilateral knees, bilateral ankles and her feet.  She also has some lower back pain.  She states 2 months ago she went to the hospital where she had lab work and it was positive for rheumatoid factor.  She was given prednisone taper which helped her.  She was diagnosed with rheumatoid arthritis by her PCP and was started on diclofenac which did not help her.  Then she was switched to meloxicam which has helped to some extent.  She still have significant pain and swelling.  She is having nocturnal pain and difficulty doing routine activities.  She is a chronic smoker she states she used to smoke 1 pack/day and now she is cut down to half pack per day.  She also has gingivitis and has teeth problems currently.  Activities of Daily Living:  Patient reports morning stiffness for 1 hour.   Patient Reports nocturnal pain.  Difficulty dressing/grooming: Reports Difficulty climbing stairs: Reports Difficulty getting out of chair: Denies Difficulty using hands for taps, buttons, cutlery, and/or writing: Reports  Review of Systems  Constitutional: Positive for fatigue. Negative for night sweats, weight gain and  weight loss.  HENT: Negative for mouth sores, trouble swallowing, trouble swallowing, mouth dryness and nose dryness.   Eyes: Negative for pain, redness, visual disturbance and dryness.  Respiratory: Negative for cough, shortness of breath and difficulty breathing.   Cardiovascular: Negative for chest pain, palpitations, hypertension, irregular heartbeat and swelling in legs/feet.  Gastrointestinal: Negative for blood in stool, constipation and diarrhea.  Endocrine: Negative for increased urination.  Genitourinary: Negative for difficulty urinating and vaginal dryness.  Musculoskeletal: Positive for arthralgias, joint pain, joint swelling, morning stiffness and muscle tenderness. Negative for myalgias, muscle weakness and myalgias.  Skin: Negative for color change, rash, hair loss, skin tightness, ulcers and sensitivity to sunlight.  Allergic/Immunologic: Negative for susceptible to infections.  Neurological: Negative for dizziness, numbness, memory loss, night sweats and weakness.  Hematological: Negative for bruising/bleeding tendency and swollen glands.  Psychiatric/Behavioral: Positive for sleep disturbance. Negative for depressed mood. The patient is not nervous/anxious.     PMFS History:  Patient Active Problem List   Diagnosis Date Noted  . Patellar instability of right knee 03/21/2012    Past Medical History:  Diagnosis Date  . Diabetes, gestational   . Heart murmur   . Hypertension   . Low iron   . Rheumatoid arthritis ([place])     Family History  Problem Relation Age of Onset  . Heart disease Unknown   . Arthritis  Unknown   . Cancer Unknown   . Asthma Unknown   . Diabetes Unknown   . Graves' disease Mother   . Heart disease Father   . Diabetes Father    Past Surgical History:  Procedure Laterality Date  . KNEE ARTHROPLASTY     Social History   Social History Narrative  . Not on file    Objective: Vital Signs: BP 107/72 (BP Location: Right Arm, Patient  Position: Sitting, Cuff Size: Normal)   Pulse 89   Resp 16   Ht 5' 7"  (1.702 m)   Wt 161 lb (73 kg)   LMP 07/09/2018 (Approximate)   BMI 25.22 kg/m    Physical Exam  Constitutional: She is oriented to person, place, and time. She appears well-developed and well-nourished.  HENT:  Head: Normocephalic and atraumatic.  Eyes: Conjunctivae and EOM are normal.  Neck: Normal range of motion.  Cardiovascular: Normal rate, regular rhythm, normal heart sounds and intact distal pulses.  Pulmonary/Chest: Effort normal and breath sounds normal.  Abdominal: Soft. Bowel sounds are normal.  Lymphadenopathy:    She has no cervical adenopathy.  Neurological: She is alert and oriented to person, place, and time.  Skin: Skin is warm and dry. Capillary refill takes less than 2 seconds.  Psychiatric: She has a normal mood and affect. Her behavior is normal.  Nursing note and vitals reviewed.    Musculoskeletal Exam: C-spine stiffness with range of motion.  Thoracic and lumbar spine good range of motion.  She is painful range of motion and tenderness over her left shoulder joint.  She is swelling over bilateral elbow joints and bilateral wrist joints with limited range of motion.  She has synovitis over MCPs and PIPs as described below.  She has warmth and swelling in bilateral knee joints.  She also has surgical scar on bilateral knee joints due to previous patellar surgery.  She has synovitis of her bilateral ankle joints and some of her MTPs as described below.  CDAI Exam: CDAI Homunculus Exam:   Tenderness:  RUE: ulnohumeral and radiohumeral and wrist LUE: glenohumeral, ulnohumeral and radiohumeral and wrist Right hand: 1st MCP, 2nd MCP, 3rd MCP, 4th MCP, 2nd PIP, 3rd PIP and 4th PIP Left hand: 1st MCP, 2nd MCP, 3rd MCP, 5th MCP, 2nd PIP, 3rd PIP and 4th PIP RLE: tibiofemoral and tibiotalar LLE: tibiofemoral and tibiotalar Right foot: 1st MTP, 2nd MTP and 3rd MTP Left foot: 2nd MTP, 3rd MTP and  4th MTP  Swelling:  RUE: ulnohumeral and radiohumeral and wrist LUE: ulnohumeral and radiohumeral and wrist Right hand: 1st MCP, 2nd MCP, 3rd MCP, 3rd PIP and 4th PIP Left hand: 1st MCP, 2nd MCP, 3rd MCP, 5th MCP, 2nd PIP and 3rd PIP RLE: tibiofemoral and tibiotalar LLE: tibiofemoral and tibiotalar  Joint Counts:  CDAI Tender Joint count: 21 CDAI Swollen Joint count: 17  Global Assessments:  Patient Global Assessment: 8 Provider Global Assessment: 8  CDAI Calculated Score: 54  Investigation: Findings:  June 22, 2018 CBC WBC 5.9, hemoglobin 14.1, platelets 130, CMP normal, TSH normal, UDS positive for cannabinoids, UA negative, ESR 30 ANA negative, RF 14 (normal 13.9) June 22, 2018 chest x-ray read normal by Dr. Nadene Rubins   Imaging: Xr Foot 2 Views Left  Result Date: 08/09/2018 First MTP narrowing and subluxation was noted.  PIP and DIP narrowing was noted.  No intertarsal, tibiotalar or subtalar joint space narrowing was noted.  No erosive changes were noted. Impression: These findings are consistent with osteoarthritis  of the foot.  Xr Foot 2 Views Right  Result Date: 08/09/2018 First MTP narrowing and subluxation was noted.  PIP and DIP narrowing was noted.  No intertarsal, tibiotalar or subtalar joint space narrowing was noted.  No erosive changes were noted. Impression: These findings are consistent with osteoarthritis of the foot.  Xr Hand 2 View Left  Result Date: 08/09/2018 PIP and DIP narrowing was noted.  Juxta-articular osteopenia was noted.  No MCP, intercarpal, radiocarpal joint space narrowing was noted.  No erosive changes were noted. Impression: These findings are consistent with osteoarthritis and inflammatory arthritis overlap.  Xr Hand 2 View Right  Result Date: 08/09/2018 PIP and DIP narrowing was noted.  Juxta-articular osteopenia was noted.  No MCP, intercarpal, radiocarpal joint space narrowing was noted.  No erosive changes were noted. Impression:  These findings are consistent with osteoarthritis and inflammatory arthritis overlap.  Xr Knee 3 View Left  Result Date: 08/09/2018 Mild to moderate medial compartment narrowing was noted.  No chondrocalcinosis was noted.  Patella alta was noted.  Screw was noted in the tibia. These findings are consistent with moderate osteoarthritis of the knee joint.  Xr Knee 3 View Right  Result Date: 08/09/2018 Mild to moderate medial compartment narrowing was noted.  No chondrocalcinosis was noted.  Patella alta was noted.  Screw was noted in the tibia. These findings are consistent with moderate osteoarthritis of the knee joint.   Recent Labs:   Speciality Comments: No specialty comments available.  Procedures:  No procedures performed Allergies: Ibuprofen   Assessment / Plan:     Visit Diagnoses: Rheumatoid arthritis involving multiple sites with positive rheumatoid factor (HCC) - Positive RF and elevated ESR, synovitis in multiple joints.  Patient has severe polyarthritis with synovitis in multiple joints as described above.  The clinical findings are consistent with rheumatoid arthritis.  Detailed counseling regarding rheumatoid arthritis was provided.  Treatment options and their side effects were discussed at length.  My plan is to obtain some baseline labs today.  Once the labs are available we will start her on methotrexate.  Indications side effects contraindications of methotrexate were discussed.  As a bridging therapy I will also start her on prednisone.  She has severe arthritis.  I will give her Depo-Medrol 80 mg intramuscular today.  She will be starting on prednisone 30 mg p.o. daily and taper by 5 mg p.o. every 4 days.  She has been advised to get some immunization including pneumococcal vaccine and Shingrix vaccine.  She will also need flu vaccine when needed.  Once the labs are available the plan is to start her methotrexate is starting at 6 tablets p.o. weekly and increasing it to 8  tablets p.o. weekly.  She will also need folic acid 2 mg p.o. daily.  In my opinion she will need more aggressive therapy including Biologics.  She has an IUD.   Drug Counseling   Patient was counseled on the purpose, proper use, and adverse effects of methotrexate including nausea, infection, and signs and symptoms of pneumonitis.  Reviewed instructions with patient to take methotrexate weekly along with folic acid daily.  Discussed the importance of frequent monitoring of kidney and liver function and blood counts, and provided patient with standing lab instructions.  Counseled patient to avoid NSAIDs and alcohol while on methotrexate.  Provided patient with educational materials on methotrexate and answered all questions.  Advised patient to get annual influenza vaccine and to get a pneumococcal vaccine if patient has not already  had one.  Patient voiced understanding.  Patient consented to methotrexate use.  Will upload into chart.    Patient also requested FMLA papers which were filled today.  Work excuse for the next 3 days was given.  Pain in both hands -she has severe synovitis in her bilateral hands and bilateral wrist joints today.  Plan: XR Hand 2 View Right, XR Hand 2 View Left, Cyclic citrul peptide antibody, IgG, ANA  Chronic pain of both knees -she has warmth and swelling and effusion in her right knee joint.  Left knee joint has warmth and swelling without effusion.  Plan: XR KNEE 3 VIEW RIGHT, XR KNEE 3 VIEW LEFT  Pain in both feet -she has synovitis in bilateral ankles and tenderness over MTPs.  Plan: XR Foot 2 Views Right, XR Foot 2 Views Left  Other fatigue - Plan: CK, VITAMIN D 25 Hydroxy (Vit-D Deficiency, Fractures)  High risk medication use - Plan: Hepatitis panel, acute, HIV antibody, QuantiFERON-TB Gold Plus, CBC with Differential/Platelet, COMPLETE METABOLIC PANEL WITH GFR, Serum protein electrophoresis with reflex, IgG, IgA, IgM, Glucose 6 phosphate dehydrogenase  Heart  murmur  Smoker - 1/2 ppd x 19 years.  Smoking cessation was discussed at length.  Association of smoking with rheumatoid arthritis was discussed.  Poor dentition-she has poor dentition and also gingivitis.  Association of gingivitis with rheumatoid arthritis was discussed.  I have advised her strongly to see a dentist.  Flossing and brushing on regular basis was also emphasized.  Orders: Orders Placed This Encounter  Procedures  . XR Hand 2 View Right  . XR Hand 2 View Left  . XR KNEE 3 VIEW RIGHT  . XR KNEE 3 VIEW LEFT  . XR Foot 2 Views Right  . XR Foot 2 Views Left  . Hepatitis panel, acute  . HIV antibody  . QuantiFERON-TB Gold Plus  . CBC with Differential/Platelet  . COMPLETE METABOLIC PANEL WITH GFR  . CK  . Cyclic citrul peptide antibody, IgG  . ANA  . Serum protein electrophoresis with reflex  . IgG, IgA, IgM  . Glucose 6 phosphate dehydrogenase  . VITAMIN D 25 Hydroxy (Vit-D Deficiency, Fractures)   Meds ordered this encounter  Medications  . predniSONE (DELTASONE) 5 MG tablet    Sig: Take 6 tablets by mouth x4 days, 5 tablets x4 days, 4 tablets x4 days, 3 tablets x4 days, 2 tablets x4 days, 1 tablet x4 days.    Dispense:  84 tablet    Refill:  0  . methylPREDNISolone acetate (DEPO-MEDROL) injection 80 mg    Face-to-face time spent with patient was 90 minutes. Greater than 50% of time was spent in counseling and coordination of care.  Follow-Up Instructions: Return in about 1 week (around 08/16/2018) for Rheumatoid arthritis.   Bo Merino, MD  Note - This record has been created using Editor, commissioning.  Chart creation errors have been sought, but may not always  have been located. Such creation errors do not reflect on  the standard of medical care.

## 2018-08-09 ENCOUNTER — Encounter (INDEPENDENT_AMBULATORY_CARE_PROVIDER_SITE_OTHER): Payer: Self-pay

## 2018-08-09 ENCOUNTER — Ambulatory Visit (INDEPENDENT_AMBULATORY_CARE_PROVIDER_SITE_OTHER): Payer: Self-pay

## 2018-08-09 ENCOUNTER — Ambulatory Visit: Payer: BLUE CROSS/BLUE SHIELD | Admitting: Rheumatology

## 2018-08-09 ENCOUNTER — Other Ambulatory Visit: Payer: Self-pay | Admitting: Pharmacist

## 2018-08-09 ENCOUNTER — Encounter: Payer: Self-pay | Admitting: Rheumatology

## 2018-08-09 ENCOUNTER — Telehealth: Payer: Self-pay | Admitting: Rheumatology

## 2018-08-09 VITALS — BP 107/72 | HR 89 | Resp 16 | Ht 67.0 in | Wt 161.0 lb

## 2018-08-09 DIAGNOSIS — F17200 Nicotine dependence, unspecified, uncomplicated: Secondary | ICD-10-CM

## 2018-08-09 DIAGNOSIS — K089 Disorder of teeth and supporting structures, unspecified: Secondary | ICD-10-CM

## 2018-08-09 DIAGNOSIS — M79641 Pain in right hand: Secondary | ICD-10-CM

## 2018-08-09 DIAGNOSIS — M79642 Pain in left hand: Secondary | ICD-10-CM

## 2018-08-09 DIAGNOSIS — M25562 Pain in left knee: Secondary | ICD-10-CM

## 2018-08-09 DIAGNOSIS — M79672 Pain in left foot: Secondary | ICD-10-CM | POA: Diagnosis not present

## 2018-08-09 DIAGNOSIS — Z79899 Other long term (current) drug therapy: Secondary | ICD-10-CM

## 2018-08-09 DIAGNOSIS — M0579 Rheumatoid arthritis with rheumatoid factor of multiple sites without organ or systems involvement: Secondary | ICD-10-CM

## 2018-08-09 DIAGNOSIS — R011 Cardiac murmur, unspecified: Secondary | ICD-10-CM

## 2018-08-09 DIAGNOSIS — M79671 Pain in right foot: Secondary | ICD-10-CM

## 2018-08-09 DIAGNOSIS — G8929 Other chronic pain: Secondary | ICD-10-CM

## 2018-08-09 DIAGNOSIS — R5383 Other fatigue: Secondary | ICD-10-CM

## 2018-08-09 DIAGNOSIS — M25561 Pain in right knee: Secondary | ICD-10-CM

## 2018-08-09 MED ORDER — PREDNISONE 5 MG PO TABS: ORAL_TABLET | ORAL | 0 refills | Status: DC

## 2018-08-09 MED ORDER — METHYLPREDNISOLONE ACETATE 40 MG/ML IJ SUSP
80.0000 mg | Freq: Once | INTRAMUSCULAR | Status: AC
  Administered 2018-08-09: 80 mg via INTRAMUSCULAR

## 2018-08-09 NOTE — Patient Instructions (Signed)
Methotrexate tablets What is this medicine? METHOTREXATE (METH oh TREX ate) is a chemotherapy drug used to treat cancer including breast cancer, leukemia, and lymphoma. This medicine can also be used to treat psoriasis and certain kinds of arthritis. This medicine may be used for other purposes; ask your health care provider or pharmacist if you have questions. COMMON BRAND NAME(S): Rheumatrex, Trexall What should I tell my health care provider before I take this medicine? They need to know if you have any of these conditions: -fluid in the stomach area or lungs -if you often drink alcohol -infection or immune system problems -kidney disease or on hemodialysis -liver disease -low blood counts, like low white cell, platelet, or red cell counts -lung disease -radiation therapy -stomach ulcers -ulcerative colitis -an unusual or allergic reaction to methotrexate, other medicines, foods, dyes, or preservatives -pregnant or trying to get pregnant -breast-feeding How should I use this medicine? Take this medicine by mouth with a glass of water. Follow the directions on the prescription label. Take your medicine at regular intervals. Do not take it more often than directed. Do not stop taking except on your doctor's advice. Make sure you know why you are taking this medicine and how often you should take it. If this medicine is used for a condition that is not cancer, like arthritis or psoriasis, it should be taken weekly, NOT daily. Taking this medicine more often than directed can cause serious side effects, even death. Talk to your healthcare provider about safe handling and disposal of this medicine. You may need to take special precautions. Talk to your pediatrician regarding the use of this medicine in children. While this drug may be prescribed for selected conditions, precautions do apply. Overdosage: If you think you have taken too much of this medicine contact a poison control center or  emergency room at once. NOTE: This medicine is only for you. Do not share this medicine with others. What if I miss a dose? If you miss a dose, talk with your doctor or health care professional. Do not take double or extra doses. What may interact with this medicine? This medicine may interact with the following medication: -acitretin -aspirin and aspirin-like medicines including salicylates -azathioprine -certain antibiotics like penicillins, tetracycline, and chloramphenicol -cyclosporine -gold -hydroxychloroquine -live virus vaccines -NSAIDs, medicines for pain and inflammation, like ibuprofen or naproxen -other cytotoxic agents -penicillamine -phenylbutazone -phenytoin -probenecid -retinoids such as isotretinoin and tretinoin -steroid medicines like prednisone or cortisone -sulfonamides like sulfasalazine and trimethoprim/sulfamethoxazole -theophylline This list may not describe all possible interactions. Give your health care provider a list of all the medicines, herbs, non-prescription drugs, or dietary supplements you use. Also tell them if you smoke, drink alcohol, or use illegal drugs. Some items may interact with your medicine. What should I watch for while using this medicine? Avoid alcoholic drinks. This medicine can make you more sensitive to the sun. Keep out of the sun. If you cannot avoid being in the sun, wear protective clothing and use sunscreen. Do not use sun lamps or tanning beds/booths. You may need blood work done while you are taking this medicine. Call your doctor or health care professional for advice if you get a fever, chills or sore throat, or other symptoms of a cold or flu. Do not treat yourself. This drug decreases your body's ability to fight infections. Try to avoid being around people who are sick. This medicine may increase your risk to bruise or bleed. Call your doctor or health care professional   if you notice any unusual bleeding. Check with your  doctor or health care professional if you get an attack of severe diarrhea, nausea and vomiting, or if you sweat a lot. The loss of too much body fluid can make it dangerous for you to take this medicine. Talk to your doctor about your risk of cancer. You may be more at risk for certain types of cancers if you take this medicine. Both men and women must use effective birth control with this medicine. Do not become pregnant while taking this medicine or until at least 1 normal menstrual cycle has occurred after stopping it. Women should inform their doctor if they wish to become pregnant or think they might be pregnant. Men should not father a child while taking this medicine and for 3 months after stopping it. There is a potential for serious side effects to an unborn child. Talk to your health care professional or pharmacist for more information. Do not breast-feed an infant while taking this medicine. What side effects may I notice from receiving this medicine? Side effects that you should report to your doctor or health care professional as soon as possible: -allergic reactions like skin rash, itching or hives, swelling of the face, lips, or tongue -breathing problems or shortness of breath -diarrhea -dry, nonproductive cough -low blood counts - this medicine may decrease the number of white blood cells, red blood cells and platelets. You may be at increased risk for infections and bleeding. -mouth sores -redness, blistering, peeling or loosening of the skin, including inside the mouth -signs of infection - fever or chills, cough, sore throat, pain or trouble passing urine -signs and symptoms of bleeding such as bloody or black, tarry stools; red or dark-brown urine; spitting up blood or brown material that looks like coffee grounds; red spots on the skin; unusual bruising or bleeding from the eye, gums, or nose -signs and symptoms of kidney injury like trouble passing urine or change in the amount  of urine -signs and symptoms of liver injury like dark yellow or brown urine; general ill feeling or flu-like symptoms; light-colored stools; loss of appetite; nausea; right upper belly pain; unusually weak or tired; yellowing of the eyes or skin Side effects that usually do not require medical attention (report to your doctor or health care professional if they continue or are bothersome): -dizziness -hair loss -tiredness -upset stomach -vomiting This list may not describe all possible side effects. Call your doctor for medical advice about side effects. You may report side effects to FDA at 1-800-FDA-1088. Where should I keep my medicine? Keep out of the reach of children. Store at room temperature between 20 and 25 degrees C (68 and 77 degrees F). Protect from light. Throw away any unused medicine after the expiration date. NOTE: This sheet is a summary. It may not cover all possible information. If you have questions about this medicine, talk to your doctor, pharmacist, or health care provider.  2018 Elsevier/Gold Standard (2015-08-17 05:39:22)  

## 2018-08-09 NOTE — Telephone Encounter (Signed)
Advised patient the methotrexate and folic acid will be sent in once labs have resulted. Patient verbalized understanding. Patient uses [facility] in [place].

## 2018-08-09 NOTE — Progress Notes (Signed)
Pharmacy Note  Subjective: Patient presents today to establish care at [facility] with Dr. Corliss Skains.   Patient seen by the pharmacist for counseling on methotrexate.    Objective: TB Test: pending Hepatitis panel: pending HIV: pending SPEP: pending Immunoglobulins: pending  Assessment/Plan:  Counseled patient of proper use, storage,  and dosing of Methotrexate.  Counseled patient on avoiding live vaccines, discontinuing methotrexate for surgical procedures, and any time patient is placed on Antibiotics. Also counseled on the importance of lab monitor every 3 months.  Patient consented to Methotrexate. Will upload consent in the media tab. Standing orders placed for lab monitor all questions were encouraged and answered.   Patient is also receiving a prednisone taper to help with swelling and inflammation.  Counseled on proper use and side effects.  Counseled on the difference between prednisone and methotrexate in treating her disease state.  She has had experience with medication before and feels comfortable taking.  Verlin Fester, PharmD, [facility] Rheumatology Clinical Pharmacist  08/09/2018 9:05 AM

## 2018-08-09 NOTE — Telephone Encounter (Signed)
Patient called stating she pick up her prescription of Prednisone, but the pharmacy didn't have the prescription of Methotrexate.  Patient requested a return call.

## 2018-08-10 ENCOUNTER — Other Ambulatory Visit: Payer: Self-pay

## 2018-08-10 DIAGNOSIS — E559 Vitamin D deficiency, unspecified: Secondary | ICD-10-CM

## 2018-08-10 MED ORDER — VITAMIN D (ERGOCALCIFEROL) 1.25 MG (50000 UNIT) PO CAPS: 50000.0000 [IU] | ORAL_CAPSULE | ORAL | 0 refills | Status: DC

## 2018-08-10 NOTE — Progress Notes (Signed)
Vit D 50,000 U twice a week X 105months. Recheck labs in 3 months.

## 2018-08-13 NOTE — Progress Notes (Signed)
Please add HCVRNA quant

## 2018-08-14 ENCOUNTER — Telehealth: Payer: Self-pay | Admitting: Rheumatology

## 2018-08-14 ENCOUNTER — Encounter: Payer: Self-pay | Admitting: *Deleted

## 2018-08-14 NOTE — Telephone Encounter (Signed)
Okay to give letter per Dr. Corliss Skains

## 2018-08-14 NOTE — Telephone Encounter (Signed)
Patient called stating her employer received the return to work letter, but she was just told that it needs to include whether there are any restrictions.

## 2018-08-14 NOTE — Telephone Encounter (Signed)
Patient advised that the return to work letter has been faxed. Patient advised that her lab results will be discussed at her follow up visit on 08/17/18.

## 2018-08-14 NOTE — Telephone Encounter (Signed)
Patient called stating she saw Dr. Corliss Skains on 08/09/18 and was taken out of work for the weekend.  Patient states she is returning to work tomorrow 08/15/18 and was told that it is company policy for her to fax a return to work note to [employer] [phone].  Patient is requesting a return call to let her know the note was faxed and to get her labwork results.

## 2018-08-15 DIAGNOSIS — M0579 Rheumatoid arthritis with rheumatoid factor of multiple sites without organ or systems involvement: Secondary | ICD-10-CM | POA: Insufficient documentation

## 2018-08-15 DIAGNOSIS — M19041 Primary osteoarthritis, right hand: Secondary | ICD-10-CM | POA: Insufficient documentation

## 2018-08-15 DIAGNOSIS — R011 Cardiac murmur, unspecified: Secondary | ICD-10-CM | POA: Insufficient documentation

## 2018-08-15 DIAGNOSIS — F17200 Nicotine dependence, unspecified, uncomplicated: Secondary | ICD-10-CM | POA: Insufficient documentation

## 2018-08-15 DIAGNOSIS — M19071 Primary osteoarthritis, right ankle and foot: Secondary | ICD-10-CM | POA: Insufficient documentation

## 2018-08-15 DIAGNOSIS — E559 Vitamin D deficiency, unspecified: Secondary | ICD-10-CM | POA: Insufficient documentation

## 2018-08-15 DIAGNOSIS — K089 Disorder of teeth and supporting structures, unspecified: Secondary | ICD-10-CM | POA: Insufficient documentation

## 2018-08-15 DIAGNOSIS — M19072 Primary osteoarthritis, left ankle and foot: Secondary | ICD-10-CM

## 2018-08-15 DIAGNOSIS — M19042 Primary osteoarthritis, left hand: Secondary | ICD-10-CM | POA: Insufficient documentation

## 2018-08-15 LAB — COMPLETE METABOLIC PANEL WITH GFR
AG Ratio: 1.5 (calc) (ref 1.0–2.5)
ALT: 23 U/L (ref 6–29)
AST: 28 U/L (ref 10–30)
Albumin: 3.8 g/dL (ref 3.6–5.1)
Alkaline phosphatase (APISO): 60 U/L (ref 33–115)
BUN: 7 mg/dL (ref 7–25)
CO2: 28 mmol/L (ref 20–32)
Calcium: 9.1 mg/dL (ref 8.6–10.2)
Chloride: 104 mmol/L (ref 98–110)
Creat: 0.69 mg/dL (ref 0.50–1.10)
GFR, Est African American: 132 mL/min/{1.73_m2} (ref >=60)
GFR, Est Non African American: 114 mL/min/{1.73_m2} (ref >=60)
Globulin: 2.6 g/dL (calc) (ref 1.9–3.7)
Glucose, Bld: 83 mg/dL (ref 65–99)
Potassium: 4.2 mmol/L (ref 3.5–5.3)
Sodium: 140 mmol/L (ref 135–146)
Total Bilirubin: 0.3 mg/dL (ref 0.2–1.2)
Total Protein: 6.4 g/dL (ref 6.1–8.1)

## 2018-08-15 LAB — CBC WITH DIFFERENTIAL/PLATELET
Basophils Absolute: 20 cells/uL (ref 0–200)
Basophils Relative: 0.4 %
Eosinophils Absolute: 186 cells/uL (ref 15–500)
Eosinophils Relative: 3.8 %
HCT: 45.1 % — ABNORMAL HIGH (ref 35.0–45.0)
Hemoglobin: 15.4 g/dL (ref 11.7–15.5)
Lymphs Abs: 2161 cells/uL (ref 850–3900)
MCH: 31.8 pg (ref 27.0–33.0)
MCHC: 34.1 g/dL (ref 32.0–36.0)
MCV: 93 fL (ref 80.0–100.0)
Monocytes Relative: 7.1 %
Neutro Abs: 2185 cells/uL (ref 1500–7800)
Neutrophils Relative %: 44.6 %
RBC: 4.85 10*6/uL (ref 3.80–5.10)
RDW: 13.2 % (ref 11.0–15.0)
Total Lymphocyte: 44.1 %
WBC mixed population: 348 cells/uL (ref 200–950)
WBC: 4.9 10*3/uL (ref 3.8–10.8)

## 2018-08-15 LAB — HCV RNA,QUANTITATIVE REAL TIME PCR
HCV Quantitative Log: <1.18 Log IU/mL
HCV RNA, PCR, QN: <15 IU/mL

## 2018-08-15 LAB — HEPATITIS PANEL, ACUTE
Hep A IgM: NONREACTIVE
Hep B C IgM: NONREACTIVE
Hepatitis B Surface Ag: NONREACTIVE
Hepatitis C Ab: REACTIVE — AB
SIGNAL TO CUT-OFF: 5.62 — ABNORMAL HIGH (ref <1.00)

## 2018-08-15 LAB — IGG, IGA, IGM
IgG (Immunoglobin G), Serum: 1015 mg/dL (ref 600–1640)
IgM, Serum: 210 mg/dL (ref 50–300)
Immunoglobulin A: 75 mg/dL (ref 47–310)

## 2018-08-15 LAB — VITAMIN D 25 HYDROXY (VIT D DEFICIENCY, FRACTURES): Vit D, 25-Hydroxy: 17 ng/mL — ABNORMAL LOW (ref 30–100)

## 2018-08-15 LAB — GLUCOSE 6 PHOSPHATE DEHYDROGENASE: G-6PDH: 14 U/g Hgb (ref 7.0–20.5)

## 2018-08-15 LAB — PROTEIN ELECTROPHORESIS, SERUM, WITH REFLEX
Albumin ELP: 3.7 g/dL — ABNORMAL LOW (ref 3.8–4.8)
Alpha 1: 0.3 g/dL (ref 0.2–0.3)
Alpha 2: 0.7 g/dL (ref 0.5–0.9)
Beta 2: 0.2 g/dL (ref 0.2–0.5)
Beta Globulin: 0.4 g/dL (ref 0.4–0.6)
Gamma Globulin: 0.9 g/dL (ref 0.8–1.7)
Total Protein: 6.3 g/dL (ref 6.1–8.1)

## 2018-08-15 LAB — QUANTIFERON-TB GOLD PLUS
Mitogen-NIL: >10 IU/mL
NIL: 0.07 IU/mL
QuantiFERON-TB Gold Plus: NEGATIVE
TB1-NIL: <0 IU/mL
TB2-NIL: <0 IU/mL

## 2018-08-15 LAB — CK: Total CK: 84 U/L (ref 29–143)

## 2018-08-15 LAB — HIV ANTIBODY (ROUTINE TESTING W REFLEX): HIV 1&2 Ab, 4th Generation: NONREACTIVE

## 2018-08-15 LAB — ANA: Anti Nuclear Antibody(ANA): NEGATIVE

## 2018-08-15 LAB — CYCLIC CITRUL PEPTIDE ANTIBODY, IGG: Cyclic Citrullin Peptide Ab: <16 UNITS

## 2018-08-15 NOTE — Telephone Encounter (Signed)
Letter written and faxed.

## 2018-08-15 NOTE — Progress Notes (Signed)
Office Visit Note  Patient: Cindy Henderson             Date of Birth: Jan 14, 1981           MRN: [id number]             PCP: Lawerance Sabal, PA Referring: Lawerance Sabal, [place] Visit Date: 08/17/2018 Occupation: @GUAROCC @  Subjective:  Pain in hands.   History of Present Illness: Cindy Henderson is a 37 y.o. female history of seropositive rheumatoid arthritis and osteoarthritis overlap.  She was a started on prednisone after the last visit due to severe rheumatoid arthritis.  She is on prednisone taper currently.  She states she still continues to have some discomfort in her bilateral hands and bilateral shoulders.  She is a 22 and has to work on Chart[employer] certified [employer].  She is coming after 12-hour shift today.  Trying to quit smoking.  Activities of Daily Living:  Patient reports morning stiffness for 30 minutes.   Patient Reports nocturnal pain.  Difficulty dressing/grooming: Reports Difficulty climbing stairs: Denies Difficulty getting out of chair: Denies Difficulty using hands for taps, buttons, cutlery, and/or writing: Reports  Review of Systems  Constitutional: Positive for fatigue. Negative for night sweats, weight gain and weight loss.  HENT: Negative for mouth sores, trouble swallowing, trouble swallowing, mouth dryness and nose dryness.   Eyes: Negative for pain, redness, visual disturbance and dryness.  Respiratory: Negative for cough, shortness of breath and difficulty breathing.   Cardiovascular: Negative for chest pain, palpitations, hypertension, irregular heartbeat and swelling in legs/feet.  Gastrointestinal: Negative for blood in stool, constipation and diarrhea.  Endocrine: Negative for increased urination.  Genitourinary: Negative for vaginal dryness.  Musculoskeletal: Positive for arthralgias, joint pain, joint swelling and morning stiffness. Negative for myalgias, muscle weakness, muscle tenderness and myalgias.  Skin: Negative for color change, rash, hair loss,  skin tightness, ulcers and sensitivity to sunlight.  Allergic/Immunologic: Negative for susceptible to infections.  Neurological: Negative for dizziness, memory loss, night sweats and weakness.  Hematological: Negative for swollen glands.  Psychiatric/Behavioral: Negative for depressed mood and sleep disturbance. The patient is not nervous/anxious.     PMFS History:  Patient Active Problem List   Diagnosis Date Noted  . Smoker 08/15/2018  . Poor dentition 08/15/2018  . Vitamin D deficiency 08/15/2018  . Rheumatoid arthritis involving multiple sites with positive rheumatoid factor (HCC) 08/15/2018  . Primary osteoarthritis/ rheumatoid arthritis of both hands 08/15/2018  . Primary osteoarthritis/ rheumatoid arthritis of both feet 08/15/2018  . Heart murmur 08/15/2018  . Patellar instability of right knee 03/21/2012    Past Medical History:  Diagnosis Date  . Diabetes, gestational   . Heart murmur   . Hypertension   . Low iron   . Rheumatoid arthritis (HCC)     Family History  Problem Relation Age of Onset  . Heart disease Unknown   . Arthritis Unknown   . Cancer Unknown   . Asthma Unknown   . Diabetes Unknown   . Graves' disease Mother   . Heart disease Father   . Diabetes Father    Past Surgical History:  Procedure Laterality Date  . KNEE ARTHROPLASTY     Social History   Social History Narrative  . Not on file    Objective: Vital Signs: BP 139/88 (BP Location: Left Arm, Patient Position: Sitting, Cuff Size: Normal)   Pulse 63   Resp 13   Ht 5\' 7"  (1.702 m)   Wt 159 lb (72.1 kg)  BMI 24.90 kg/m    Physical Exam  Constitutional: She is oriented to person, place, and time. She appears well-developed and well-nourished.  HENT:  Head: Normocephalic and atraumatic.  Eyes: Conjunctivae and EOM are normal.  Neck: Normal range of motion.  Cardiovascular: Normal rate, regular rhythm, normal heart sounds and intact distal pulses.  Pulmonary/Chest: Effort normal  and breath sounds normal.  Abdominal: Soft. Bowel sounds are normal.  Lymphadenopathy:    She has no cervical adenopathy.  Neurological: She is alert and oriented to person, place, and time.  Skin: Skin is warm and dry. Capillary refill takes less than 2 seconds.  Psychiatric: She has a normal mood and affect. Her behavior is normal.  Nursing note and vitals reviewed.    Musculoskeletal Exam: C-spine thoracic lumbar spine good range of motion.  Shoulder joints discomfort with range of motion.  She has bilateral elbow joint contractures and tenderness on palpation.  She has tenderness over bilateral PIP joints with some synovial thickening.  No active synovitis was noted.  Hip joints were in good range of motion.  She has warmth and swelling of her bilateral knee joints.  All other joints are good range of motion with no synovitis.  CDAI Exam: CDAI Score: 15  Patient Global Assessment: 4 (mm); Provider Global Assessment: 6 (mm) Swollen: 2 ; Tender: 12  Joint Exam      Right  Left  Glenohumeral   Tender   Tender  PIP 2   Tender   Tender  PIP 3   Tender   Tender  PIP 4   Tender   Tender  PIP 5   Tender   Tender  Knee  Swollen Tender  Swollen Tender     Investigation: No additional findings.  Imaging: Dg Chest 2 View  Result Date: 08/17/2018 CLINICAL DATA:  Chronic cough. Current smoker. Recently diagnosed with rheumatoid arthritis and beginning immunosuppressive therapy. EXAM: CHEST - 2 VIEW COMPARISON:  Chest x-ray dated June 22, 2018. FINDINGS: The heart size and mediastinal contours are within normal limits. Both lungs are clear. The visualized skeletal structures are unremarkable. IMPRESSION: Normal chest. Electronically Signed   By: Obie Dredge M.D.   On: 08/17/2018 11:03   Xr Foot 2 Views Left  Result Date: 08/09/2018 First MTP narrowing and subluxation was noted.  PIP and DIP narrowing was noted.  No intertarsal, tibiotalar or subtalar joint space narrowing was noted.   No erosive changes were noted. Impression: These findings are consistent with osteoarthritis of the foot.  Xr Foot 2 Views Right  Result Date: 08/09/2018 First MTP narrowing and subluxation was noted.  PIP and DIP narrowing was noted.  No intertarsal, tibiotalar or subtalar joint space narrowing was noted.  No erosive changes were noted. Impression: These findings are consistent with osteoarthritis of the foot.  Xr Hand 2 View Left  Result Date: 08/09/2018 PIP and DIP narrowing was noted.  Juxta-articular osteopenia was noted.  No MCP, intercarpal, radiocarpal joint space narrowing was noted.  No erosive changes were noted. Impression: These findings are consistent with osteoarthritis and inflammatory arthritis overlap.  Xr Hand 2 View Right  Result Date: 08/09/2018 PIP and DIP narrowing was noted.  Juxta-articular osteopenia was noted.  No MCP, intercarpal, radiocarpal joint space narrowing was noted.  No erosive changes were noted. Impression: These findings are consistent with osteoarthritis and inflammatory arthritis overlap.  Xr Knee 3 View Left  Result Date: 08/09/2018 Mild to moderate medial compartment narrowing was noted.  No chondrocalcinosis  was noted.  Patella alta was noted.  Screw was noted in the tibia. These findings are consistent with moderate osteoarthritis of the knee joint.  Xr Knee 3 View Right  Result Date: 08/09/2018 Mild to moderate medial compartment narrowing was noted.  No chondrocalcinosis was noted.  Patella alta was noted.  Screw was noted in the tibia. These findings are consistent with moderate osteoarthritis of the knee joint.   Recent Labs: Lab Results  Component Value Date   WBC 4.9 08/09/2018   HGB 15.4 08/09/2018   PLT CANCELED 08/09/2018   NA 140 08/09/2018   K 4.2 08/09/2018   CL 104 08/09/2018   CO2 28 08/09/2018   GLUCOSE 83 08/09/2018   BUN 7 08/09/2018   CREATININE 0.69 08/09/2018   BILITOT 0.3 08/09/2018   AST 28 08/09/2018   ALT 23  08/09/2018   PROT 6.4 08/09/2018   PROT 6.3 08/09/2018   CALCIUM 9.1 08/09/2018   GFRAA 132 08/09/2018   QFTBGOLDPLUS NEGATIVE 08/09/2018  Anti-CCP negative, ANA negative, CK 84, G6PD normal, vitamin D 17, HIV negative, SPEP normal, immunoglobulins normal, hepatitis B-, hepatitis C reactive, hep C RNA quantitative negative.  TB gold negative,  Speciality Comments: No specialty comments available.  Procedures:  No procedures performed Allergies: Ibuprofen   Assessment / Plan:     Visit Diagnoses: Rheumatoid arthritis involving multiple sites with positive rheumatoid factor (HCC)-patient has severe rheumatoid arthritis involving multiple joints.  She is on prednisone taper currently.  Despite taking prednisone 25 mg p.o. daily she still have significant pain and swelling.  We had detailed discussion regarding methotrexate last visit and informed consent was taken.  Her labs are normal.  We will initiate methotrexate at 6 tablets p.o. weekly and increase it to 8 tablets p.o. weekly after 2 weeks if her labs are stable.  She will be also started on folic acid 2 mg p.o. daily.  We will check labs every 2 weeks x 2 and then every 2 months to monitor for drug toxicity.  We also discussed due to aggressive nature of her disease she may need combination therapy.  High risk medication use - Plan: DG Chest 2 View PA and lateral today.  She will need labs in 2 weeks x 2 and then every 2 months.  She has been advised to get pneumococcal vaccine.  Primary osteoarthritis/ rheumatoid arthritis of both hands-she has severe arthritis with pain and discomfort.  Primary osteoarthritis/ rheumatoid arthritis of both feet-she has tenderness across MTPs and PIPs.  Smoker - 1/2 ppd x 19 years.  Smoking cessation was again emphasized.  Poor dentition-patient's states that she has started flossing her teeth and will schedule appoint with the dentist.  Vitamin D deficiency-she started taking vitamin D.  Heart  murmur   Orders: Orders Placed This Encounter  Procedures  . DG Chest 2 View  . CBC with Differential/Platelet  . COMPLETE METABOLIC PANEL WITH GFR   Meds ordered this encounter  Medications  . methotrexate (RHEUMATREX) 2.5 MG tablet    Sig: Take 6 tablets (15mg ) weekly for 2 weeks then 8 tablets (20mg ) weekly. Caution:Chemotherapy. Protect from light.    Dispense:  28 tablet    Refill:  0  . folic acid (FOLVITE) 1 MG tablet    Sig: Take 2 tablets (2 mg total) by mouth daily.    Dispense:  60 tablet    Refill:  2    Face-to-face time spent with patient was 30 minutes. Greater than 50% of  time was spent in counseling and coordination of care.  Follow-Up Instructions: Return in about 2 months (around 10/17/2018) for Rheumatoid arthritis.   Pollyann Savoy, MD  Note - This record has been created using Animal nutritionist.  Chart creation errors have been sought, but may not always  have been located. Such creation errors do not reflect on  the standard of medical care.

## 2018-08-17 ENCOUNTER — Ambulatory Visit: Payer: BLUE CROSS/BLUE SHIELD | Admitting: Rheumatology

## 2018-08-17 ENCOUNTER — Ambulatory Visit (HOSPITAL_COMMUNITY)
Admission: RE | Admit: 2018-08-17 | Discharge: 2018-08-17 | Disposition: A | Discharge status: Home / Self Care | Payer: BLUE CROSS/BLUE SHIELD | Source: Ambulatory Visit | Attending: Rheumatology | Admitting: Rheumatology

## 2018-08-17 ENCOUNTER — Encounter: Payer: Self-pay | Admitting: Rheumatology

## 2018-08-17 VITALS — BP 139/88 | HR 63 | Resp 13 | Ht 67.0 in | Wt 159.0 lb

## 2018-08-17 DIAGNOSIS — K089 Disorder of teeth and supporting structures, unspecified: Secondary | ICD-10-CM

## 2018-08-17 DIAGNOSIS — F17200 Nicotine dependence, unspecified, uncomplicated: Secondary | ICD-10-CM

## 2018-08-17 DIAGNOSIS — M19041 Primary osteoarthritis, right hand: Secondary | ICD-10-CM

## 2018-08-17 DIAGNOSIS — M0579 Rheumatoid arthritis with rheumatoid factor of multiple sites without organ or systems involvement: Secondary | ICD-10-CM | POA: Diagnosis not present

## 2018-08-17 DIAGNOSIS — R011 Cardiac murmur, unspecified: Secondary | ICD-10-CM

## 2018-08-17 DIAGNOSIS — M19071 Primary osteoarthritis, right ankle and foot: Secondary | ICD-10-CM

## 2018-08-17 DIAGNOSIS — Z79899 Other long term (current) drug therapy: Secondary | ICD-10-CM | POA: Diagnosis not present

## 2018-08-17 DIAGNOSIS — M19072 Primary osteoarthritis, left ankle and foot: Secondary | ICD-10-CM

## 2018-08-17 DIAGNOSIS — E559 Vitamin D deficiency, unspecified: Secondary | ICD-10-CM

## 2018-08-17 DIAGNOSIS — M19042 Primary osteoarthritis, left hand: Secondary | ICD-10-CM

## 2018-08-17 MED ORDER — FOLIC ACID 1 MG PO TABS: 2.0000 mg | ORAL_TABLET | Freq: Every day | ORAL | 2 refills | Status: DC

## 2018-08-17 MED ORDER — METHOTREXATE 2.5 MG PO TABS: ORAL_TABLET | ORAL | 0 refills | Status: DC

## 2018-08-17 NOTE — Patient Instructions (Signed)
Standing Labs We placed an order today for your standing lab work.    Please come back and get your standing labs in 2 weeks x 2 and then every 2 months  We have open lab Monday through Friday from 8:30-11:30 AM and 1:30-4:00 PM  at the office of Dr. Jayna Mulnix.   You may experience shorter wait times on Monday and Friday afternoons. The office is located at [address], [address], [place], [place] [address] No appointment is necessary.   Labs are drawn by [facility].  You may receive a bill from [facility] for your lab work. If you have any questions regarding directions or hours of operation,  please call [phone].    

## 2018-08-30 ENCOUNTER — Other Ambulatory Visit: Payer: Self-pay

## 2018-08-30 ENCOUNTER — Telehealth: Payer: Self-pay | Admitting: Rheumatology

## 2018-08-30 DIAGNOSIS — Z79899 Other long term (current) drug therapy: Secondary | ICD-10-CM

## 2018-08-30 NOTE — Telephone Encounter (Signed)
Patient called requesting her labwork orders be sent to [facility] in [place].   Patient states she is going tomorrow morning 08/31/18.  Please fax orders to #[phone] and include signature and diagnosis

## 2018-08-30 NOTE — Telephone Encounter (Signed)
Advised patient that lab orders have been faxed to number provided.

## 2018-09-12 ENCOUNTER — Ambulatory Visit: Payer: Self-pay | Admitting: Rheumatology

## 2018-09-13 ENCOUNTER — Telehealth: Payer: Self-pay | Admitting: Rheumatology

## 2018-09-13 ENCOUNTER — Other Ambulatory Visit: Payer: Self-pay | Admitting: *Deleted

## 2018-09-13 DIAGNOSIS — Z79899 Other long term (current) drug therapy: Secondary | ICD-10-CM

## 2018-09-13 MED ORDER — PREDNISONE 5 MG PO TABS: ORAL_TABLET | ORAL | 0 refills | Status: DC

## 2018-09-13 MED ORDER — METHOTREXATE 2.5 MG PO TABS: 20.0000 mg | ORAL_TABLET | ORAL | 0 refills | Status: DC

## 2018-09-13 NOTE — Telephone Encounter (Signed)
Patient advised we do want to continue her the MTX. Patient advised lab orders have been faxed. Patient states she took last dose of Prednisone a few days ago. Patient states her job is making them do mandatory overtime. Patient states she is having pain in her hands and a "bump" on the back of her hands. Patient states from past experience after the "bump" comes on the back of her wrist the swelling will begin. Patient states she is also having pain in her shoulders. Please advise.

## 2018-09-13 NOTE — Telephone Encounter (Signed)
Patient called requesting her labwork orders be faxed to [facility].   Fax #[phone]  Patient states she is going tomorrow morning.  Patient states she took her last dose of Methotrexate on Sunday 09/09/18 and is not sure if she is suppose to continue taking it.  Patient states that she took her last dose of Prednisone 2 days ago and the pain is beginning to return.  Patient requested a return call regarding her medications and to let her know we were able to fax labwork orders.

## 2018-09-13 NOTE — Telephone Encounter (Signed)
Patient advised a prescription for prednisone has been sent to the pharmacy. Patient states she needs a refill on MTX as well. Prescription sent to the pharmacy.

## 2018-09-13 NOTE — Telephone Encounter (Signed)
She can take another prednisone taper starting at 20 mg p.o. daily and taper by 5 mg every 2 days.  If she has another flare after prednisone taper then she will need a biologic agent.

## 2018-09-14 ENCOUNTER — Encounter: Payer: Self-pay | Admitting: Rheumatology

## 2018-10-09 ENCOUNTER — Telehealth: Payer: Self-pay | Admitting: Rheumatology

## 2018-10-09 DIAGNOSIS — M0579 Rheumatoid arthritis with rheumatoid factor of multiple sites without organ or systems involvement: Secondary | ICD-10-CM

## 2018-10-09 MED ORDER — FOLIC ACID 1 MG PO TABS: 2.0000 mg | ORAL_TABLET | Freq: Every day | ORAL | 2 refills | Status: DC

## 2018-10-09 MED ORDER — PREDNISONE 5 MG PO TABS: ORAL_TABLET | ORAL | 0 refills | Status: DC

## 2018-10-09 NOTE — Telephone Encounter (Signed)
Patient called stating her hands, feet, and knees are swollen.  Patient states she also has "shooting pain" in her hands and no strength.  Patient states she has just finished a course of Prednisone which helped, but stated Dr. Corliss Skains told her "she didn't want to keep prescribing Prednsione."  Patient is requesting pain medicine that she can take "when she needs it" to help her with the swelling and pain.  Patient also requested a prescription refill of Folic Acid to be sent to [facility] in [place].

## 2018-10-09 NOTE — Telephone Encounter (Signed)
Ok to provide prednisone 5 mg tablets starting at 20 mg and tapering by 5 mg every 4 days.  We will need to discuss combination therapy at her next visit.  She was started on MTX in August and continues to have frequent flares.

## 2018-10-09 NOTE — Addendum Note (Signed)
Addended by: Henriette Combs on: 10/09/2018 12:50 PM   Modules accepted: Orders

## 2018-10-09 NOTE — Telephone Encounter (Signed)
Patient returned call to the office and is unable to make the appointment for today. Patient was offered multiple other appointments and declined each appointment offered. Patient has a follow up appointment on 10/24/18 and states she will just wait until that appointment. Patient is requesting a prescription for prednisone.Please advise.

## 2018-10-09 NOTE — Telephone Encounter (Signed)
Left message to advise patient prednisone prescription has been sent to the pharmacy. Patient also requested refill on Folic Acid   Last Visit: 08/17/18 Next Visit: 10/24/18  Okay to refill per Dr. Corliss Skains

## 2018-10-09 NOTE — Telephone Encounter (Signed)
Patient states she is having pain in her wrists and having trouble making a fist. Patient advised we will need evaluate her here in the office. Patient offered an appointment for 1:20 pm today and patient states she will have to contact her mother as she is her transportation. Patient will call the office if she is able to make that appointment. Patient advised that we do not prescribe pain medication. Patient verbalized understanding.

## 2018-10-10 NOTE — Progress Notes (Signed)
Office Visit Note  Patient: Cindy Henderson             Date of Birth: 10-15-1981           MRN: [id number]             PCP: Lawerance Sabal, PA Referring: Lawerance Sabal, [place] Visit Date: 10/24/2018 Occupation: @GUAROCC @  Subjective:  Pain and swelling in both hands   History of Present Illness: Cindy Henderson is a 37 y.o. female with history of seropositive rheumatoid arthritis and osteoarthritis.  She is on MTX 8 tablets by mouth once weekly and folic acid 2 mg po daily.  She continues to have recurrent flares.  She is currently having pain and swelling in both hands. She reports she has pain in both knees, feet, and both shoulders.  She reports both shoulders have been keeping her up at night due to the pain. She states she has tried to cut down on smoking.  She was recently on a prednisone taper.  She is currently on prednisone 10 mg by mouth daily.    Activities of Daily Living:  Patient reports morning stiffness for 1  hour.   Patient Reports nocturnal pain.  Difficulty dressing/grooming: Denies Difficulty climbing stairs: Reports Difficulty getting out of chair: Reports Difficulty using hands for taps, buttons, cutlery, and/or writing: Reports  Review of Systems  Constitutional: Positive for fatigue.  HENT: Negative for mouth sores, mouth dryness and nose dryness.   Eyes: Negative for pain, visual disturbance and dryness.  Respiratory: Negative for cough, hemoptysis, shortness of breath and difficulty breathing.   Cardiovascular: Negative for chest pain, palpitations, hypertension and swelling in legs/feet.  Gastrointestinal: Negative for blood in stool, constipation and diarrhea.  Endocrine: Negative for increased urination.  Genitourinary: Negative for painful urination.  Musculoskeletal: Positive for arthralgias, joint pain, joint swelling and morning stiffness. Negative for myalgias, muscle weakness, muscle tenderness and myalgias.  Skin: Negative for color change, pallor,  rash, hair loss, nodules/bumps, skin tightness, ulcers and sensitivity to sunlight.  Allergic/Immunologic: Negative for susceptible to infections.  Neurological: Negative for dizziness, numbness, headaches and weakness.  Hematological: Negative for swollen glands.  Psychiatric/Behavioral: Negative for depressed mood and sleep disturbance. The patient is not nervous/anxious.     PMFS History:  Patient Active Problem List   Diagnosis Date Noted  . Smoker 08/15/2018  . Poor dentition 08/15/2018  . Vitamin D deficiency 08/15/2018  . Rheumatoid arthritis involving multiple sites with positive rheumatoid factor (HCC) 08/15/2018  . Primary osteoarthritis/ rheumatoid arthritis of both hands 08/15/2018  . Primary osteoarthritis/ rheumatoid arthritis of both feet 08/15/2018  . Heart murmur 08/15/2018  . Patellar instability of right knee 03/21/2012    Past Medical History:  Diagnosis Date  . Diabetes, gestational   . Heart murmur   . Hypertension   . Low iron   . Rheumatoid arthritis (HCC)     Family History  Problem Relation Age of Onset  . Heart disease Unknown   . Arthritis Unknown   . Cancer Unknown   . Asthma Unknown   . Diabetes Unknown   . Graves' disease Mother   . Heart disease Father   . Diabetes Father    Past Surgical History:  Procedure Laterality Date  . KNEE ARTHROPLASTY     Social History   Social History Narrative  . Not on file    Objective: Vital Signs: BP 110/68 (BP Location: Left Arm, Patient Position: Sitting, Cuff Size: Small)   Pulse  65   Resp 12   Ht 5\' 7"  (1.702 m)   Wt 160 lb 12.8 oz (72.9 kg)   BMI 25.18 kg/m    Physical Exam  Constitutional: She is oriented to person, place, and time. She appears well-developed and well-nourished.  HENT:  Head: Normocephalic and atraumatic.  Eyes: Conjunctivae and EOM are normal.  Neck: Normal range of motion.  Cardiovascular: Normal rate, regular rhythm and intact distal pulses.  Murmur  heard. Pulmonary/Chest: Effort normal and breath sounds normal.  Abdominal: Soft. Bowel sounds are normal.  Lymphadenopathy:    She has no cervical adenopathy.  Neurological: She is alert and oriented to person, place, and time.  Skin: Skin is warm and dry. Capillary refill takes less than 2 seconds.  Psychiatric: She has a normal mood and affect. Her behavior is normal.  Nursing note and vitals reviewed.    Musculoskeletal Exam: C-spine, thoracic spine, and lumbar spine good ROM.  No midline spinal tenderness.  No SI joint tenderness. bilateral elbow joint contracture-5 degrees. Limited wrist ROM bilaterally.  Synovitis of right 3rd, 4th, and 5th PIP and left 3rd MCP and 2nd and 3rd PIP joints. Hip joints good ROM with no discomfort. Bilateral knee warmth and swelling.  No tenderness of swelling or tenderness of ankle joints.    CDAI Exam: CDAI Score: 17.4  Patient Global Assessment: 7 (mm); Provider Global Assessment: 7 (mm) Swollen: 8 ; Tender: 8  Joint Exam      Right  Left  MCP 3     Swollen Tender  PIP 2     Swollen Tender  PIP 3  Swollen Tender  Swollen Tender  PIP 4  Swollen Tender     PIP 5  Swollen Tender     Knee  Swollen Tender  Swollen Tender     Investigation: No additional findings.  Imaging: No results found.  Recent Labs: Lab Results  Component Value Date   WBC 4.9 08/09/2018   HGB 15.4 08/09/2018   PLT CANCELED 08/09/2018   NA 140 08/09/2018   K 4.2 08/09/2018   CL 104 08/09/2018   CO2 28 08/09/2018   GLUCOSE 83 08/09/2018   BUN 7 08/09/2018   CREATININE 0.69 08/09/2018   BILITOT 0.3 08/09/2018   AST 28 08/09/2018   ALT 23 08/09/2018   PROT 6.4 08/09/2018   PROT 6.3 08/09/2018   CALCIUM 9.1 08/09/2018   GFRAA 132 08/09/2018   QFTBGOLDPLUS NEGATIVE 08/09/2018    Speciality Comments: No specialty comments available.  Procedures:  No procedures performed Allergies: Ibuprofen     Assessment / Plan:     Visit Diagnoses: Rheumatoid  arthritis involving multiple sites with positive rheumatoid factor (HCC) - Severe rheumatoid arthritis involving multiple joints.  She continues to have active synovitis as described above. She was started on MTX in August and continues to have frequent flares requiring prednisone tapers on 8/15, 9/19, 10/15.  She is currently on prednisone 10 mg by mouth daily.  At her last visit we discussed started her on combination therapy of MTX and Humira. A test claim for Humira was ran and approved with $5 co-pay.  She is eligible to fill at [facility].  Indications, contraindications, potential side effects were reviewed today in the office.  She was given her first injection today in the office and observed for 30 minutes after.  All questions were addressed.  A prescription for Humira will be sent to the pharmacy.  She will follow-up in  the office in 3 months.  She is advised to notify us if she develops increased joint pain or joint swelling.  Association of heart disease with rheumatoid arthritis was discussed. Need to monitor blood pressure, cholesterol, and to exercise 30-60 minutes on daily basis was discussed. Poor dental hygiene can be a predisposing factor for rheumatoid arthritis. Good dental hygiene was discussed.  Medication counseling:   TB Test: negative 08/09/18 Hepatitis panel: HCV quantitative not detected 08/09/18 HIV: Non reactive 08/09/18 SPEP: 08/09/18 Immunoglobulins: 08/09/18  Chest x-ray: Normal CXR 08/17/18  Does patient have diagnosis of heart failure?  No  Counseled patient that Humira is a TNF blocking agent.  Reviewed Humira dose of 40 mg every other week.  Counseled patient on purpose, proper use, and adverse effects of Humira.  Reviewed the most common adverse effects including infections, headache, and injection site reactions. Discussed that there is the possibility of an increased risk of malignancy but it is not well understood if this increased risk is due  to the medication or the disease state.  Advised patient to get yearly dermatology exams due to risk of skin cancer.  Reviewed the importance of regular labs while on Humira therapy.  Advised patient to get standing labs one month after starting Humira then every 3 months.  Provided patient with standing lab orders.  Counseled patient that Humira should be held prior to scheduled surgery.  Counseled patient to avoid live vaccines while on Humira.  Advised patient to get annual influenza vaccine and the pneumococcal vaccine as needed.  Provided patient with medication education material and answered all questions.  Patient voiced understanding.  Patient consented to Humira.  Will upload consent into the media tab.  Reviewed storage instructions of Humira.  Advised initial injection must be administered in office.    Patient voiced understanding.    High risk medication use - MTX 8 tablets by mouth once weekly, folic acid 2 mg by mouth daily.  She will be starting on Humira today in the office.  CBC and CMP were drawn on 08/31/18.  All baseline biologic lab work was obtained on 08/09/18.    Primary osteoarthritis/ rheumatoid arthritis of both hands: She has active synovitis as described above.   Primary osteoarthritis/ rheumatoid arthritis of both feet: She continues to have pain in both feet.   Vitamin D deficiency  Poor dentition: We discussed the importance of good dental hygiene.   Heart murmur  Smoker - 1/2 ppd x 19 years. Tobacco cessation was discussed.  She is going to continue to try to cut back. CXR normal on 08/17/18.     Recommend annual flu and Pneumovax 23 as indicated.  Orders: No orders of the defined types were placed in this encounter.  No orders of the defined types were placed in this encounter.   Face-to-face time spent with patient was 30 minutes. Greater than 50% of time was spent in counseling and coordination of care.  Follow-Up Instructions: Return in about 3 months  (around 01/24/2019) for Rheumatoid arthritis.   Gearldine Bienenstock, PA-C  I examined and evaluated the patient with Sherron Ales PA.  Patient has active synovitis on my examination today.  We had detailed discussion regarding use of Humira during the last visit.  She was in agreement.  Humira has been approved.  She was given the first injection of Humira in the office today.  She was observed in the office for 30 minutes and had no side effects.  We will  send in a prescription for Humira.  She has been advised to get labs in a month and every 3 months to monitor for drug toxicity.  She will continue methotrexate along with Humira.  The plan of care was discussed as noted above.  Pollyann Savoy, MD  Note - This record has been created using Animal nutritionist.  Chart creation errors have been sought, but may not always  have been located. Such creation errors do not reflect on  the standard of medical care.

## 2018-10-23 ENCOUNTER — Telehealth: Payer: Self-pay | Admitting: Pharmacy Technician

## 2018-10-23 NOTE — Telephone Encounter (Signed)
Received a Prior Authorization request from [facility] for Humira. Authorization has been submitted to patient's insurance via Cover My Meds. Will update once we receive a response.  2:36 PM Dorthula Nettles, CPhT

## 2018-10-24 ENCOUNTER — Encounter: Payer: Self-pay | Admitting: Rheumatology

## 2018-10-24 ENCOUNTER — Ambulatory Visit: Payer: BLUE CROSS/BLUE SHIELD | Admitting: Rheumatology

## 2018-10-24 VITALS — BP 107/61 | HR 61 | Resp 12 | Ht 67.0 in | Wt 160.8 lb

## 2018-10-24 DIAGNOSIS — M19041 Primary osteoarthritis, right hand: Secondary | ICD-10-CM | POA: Diagnosis not present

## 2018-10-24 DIAGNOSIS — Z79899 Other long term (current) drug therapy: Secondary | ICD-10-CM

## 2018-10-24 DIAGNOSIS — F17200 Nicotine dependence, unspecified, uncomplicated: Secondary | ICD-10-CM

## 2018-10-24 DIAGNOSIS — M19071 Primary osteoarthritis, right ankle and foot: Secondary | ICD-10-CM

## 2018-10-24 DIAGNOSIS — M19042 Primary osteoarthritis, left hand: Secondary | ICD-10-CM

## 2018-10-24 DIAGNOSIS — M19072 Primary osteoarthritis, left ankle and foot: Secondary | ICD-10-CM

## 2018-10-24 DIAGNOSIS — M0579 Rheumatoid arthritis with rheumatoid factor of multiple sites without organ or systems involvement: Secondary | ICD-10-CM | POA: Diagnosis not present

## 2018-10-24 DIAGNOSIS — E559 Vitamin D deficiency, unspecified: Secondary | ICD-10-CM

## 2018-10-24 DIAGNOSIS — K089 Disorder of teeth and supporting structures, unspecified: Secondary | ICD-10-CM

## 2018-10-24 DIAGNOSIS — R011 Cardiac murmur, unspecified: Secondary | ICD-10-CM

## 2018-10-24 MED ORDER — ADALIMUMAB 40 MG/0.4ML ~~LOC~~ AJKT: 40.0000 mg | AUTO-INJECTOR | SUBCUTANEOUS | 0 refills | Status: DC

## 2018-10-24 MED ORDER — ADALIMUMAB 40 MG/0.4ML ~~LOC~~ AJKT
40.0000 mg | AUTO-INJECTOR | Freq: Once | SUBCUTANEOUS | Status: AC
  Administered 2018-10-24: 40 mg via SUBCUTANEOUS

## 2018-10-24 NOTE — Progress Notes (Signed)
Pharmacy Note Subjective: Patient presents today to the [facility] to see Dr. Corliss Skains.   Patient seen by the pharmacist for counseling on Humira for seronegative rheumatoid arthritis. Current therapy includes methotrexate 20 mg weekly which was started on 08/17/18.  Objective: Quantiferon TB Gold Latest Ref Rng & Units 08/09/2018  Quantiferon TB Gold Plus NEGATIVE NEGATIVE    Hepatitis Latest Ref Rng & Units 08/09/2018  Hep B Surface Ag NON-REACTI NON-REACTIVE  Hep B IgM NON-REACTI NON-REACTIVE  Hep C Ab NON-REACTI REACTIVE(A)  Hep C Ab NON-REACTI REACTIVE(A)  Hep A IgM NON-REACTI NON-REACTIVE   Lab Results  Component Value Date   HCVRNAPCRQN <15 NOT DETECTED 08/09/2018   Lab Results  Component Value Date   HIV NON-REACTIVE 08/09/2018    Immunoglobulin Electrophoresis Latest Ref Rng & Units 08/09/2018  IgA  47 - 310 mg/dL 75  IgG 277 - 8,242 mg/dL 3,536  IgM 50 - 144 mg/dL 315    Serum Protein Electrophoresis Latest Ref Rng & Units 08/09/2018  Total Protein 6.1 - 8.1 g/dL 6.3  Albumin 3.8 - 4.8 g/dL 4.0(G)  Alpha-1 0.2 - 0.3 g/dL 0.3  Alpha-2 0.5 - 0.9 g/dL 0.7  Beta Globulin 0.4 - 0.6 g/dL 0.4  Beta 2 0.2 - 0.5 g/dL 0.2  Gamma Globulin 0.8 - 1.7 g/dL 0.9    Lab Results  Component Value Date   G6PDH 14.0 08/09/2018    Chest x-ray: no acute disease or abnormalities 08/17/18  CBC    Component Value Date/Time   WBC 4.9 08/09/2018 0913   RBC 4.85 08/09/2018 0913   HGB 15.4 08/09/2018 0913   HCT 45.1 (H) 08/09/2018 0913   PLT CANCELED 08/09/2018 0913   MCV 93.0 08/09/2018 0913   MCH 31.8 08/09/2018 0913   MCHC 34.1 08/09/2018 0913   RDW 13.2 08/09/2018 0913   LYMPHSABS 2,161 08/09/2018 0913   EOSABS 186 08/09/2018 0913   BASOSABS 20 08/09/2018 0913    Does patient have diagnosis of heart failure?  No  Assessment/Plan:  Counseled patient that Humira is a TNF blocking agent.  Reviewed Humira dose of 40 mg every other week.  Counseled patient on  purpose, proper use, and adverse effects of Humira.  Reviewed the most common adverse effects including infections, headache, and injection site reactions. Discussed that there is the possibility of an increased risk of malignancy but it is not well understood if this increased risk is due to the medication or the disease state.  Advised patient to get yearly dermatology exams due to risk of skin cancer.  Reviewed the importance of regular labs while on Humira therapy.  Advised patient to get standing labs one month after starting Humira then every 3 months.  Provided patient with standing lab orders.  Counseled patient that Humira should be held prior to scheduled surgery.  Counseled patient to avoid live vaccines while on Humira.  Advised patient to get annual influenza vaccine and the pneumococcal vaccine as needed.  Provided patient with medication education material and answered all questions.  Patient voiced understanding.  Patient consented to Humira.  Will upload consent into the media tab.  Reviewed storage instructions of Humira.  Advised initial injection must be administered in office.    Patient voiced understanding.    Demonstrated proper injection technique with Humira demo pen.  Patient able to demonstrate proper injection technique using the teach back method.  Patient self injected in the right lower abdomen with:  Sample Medication: Humira 40mg /0.23mL pen NDC: [id number]  Lot: [id number] Expiration: 08/2019  Patient tolerated well.  Observed for 30 mins in office for adverse reaction and no reaction noted.   Prescription sent to [facility].  Rachael discussed pharmacy services and helped set up medication shipment.  Instructed patient to call with any questions/issues.    Verlin Fester, PharmD, BCACP Rheumatology Clinical Pharmacist  10/24/2018 2:18 PM

## 2018-10-24 NOTE — Patient Instructions (Addendum)
**Next Humira Injection Due on 11/13**  Standing Labs We placed an order today for your standing lab work.    Please come back and get your standing labs in 1 month then every 3 months   We have open lab Monday through Friday from 8:30-11:30 AM and 1:30-4:00 PM  at the office of Dr. Pollyann Savoy.   You may experience shorter wait times on Monday and Friday afternoons. The office is located at [address], [place], [place] [address] No appointment is necessary.   Labs are drawn by [facility].  You may receive a bill from [facility] for your lab work. If you have any questions regarding directions or hours of operation,  please call [phone].   Just as a reminder please drink plenty of water prior to coming for your lab work. Thanks!  Vaccines You are taking a medication(s) that can suppress your immune system.  The following immunizations are recommended: . Flu annually . Pneumonia (Pneumovax 23 and Prevnar 13 after age 4) . Hepatitis B  Please check with your PCP to make sure you are up to date.  In addition to staying up to date on lab work and vaccines it is important to:  Marland Kitchen Yearly skin checks with dermatologist due to increase risk in skin cancer with TNF inhibitors .   Adalimumab Injection What is this medicine? ADALIMUMAB (a dal AYE mu mab) is used to treat rheumatoid and psoriatic arthritis. It is also used to treat ankylosing spondylitis, Crohn's disease, ulcerative colitis, plaque psoriasis, hidradenitis suppurativa, and uveitis. This medicine may be used for other purposes; ask your health care provider or pharmacist if you have questions. COMMON BRAND NAME(S): CYLTEZO, Humira What should I tell my health care provider before I take this medicine? They need to know if you have any of these conditions: -diabetes -heart disease -hepatitis B or history of hepatitis B infection -immune system problems -infection or history of infections -multiple  sclerosis -recently received or scheduled to receive a vaccine -scheduled to have surgery -tuberculosis, a positive skin test for tuberculosis or have recently been in close contact with someone who has tuberculosis -an unusual reaction to adalimumab, other medicines, mannitol, latex, rubber, foods, dyes, or preservatives -pregnant or trying to get pregnant -breast-feeding How should I use this medicine? This medicine is for injection under the skin. You will be taught how to prepare and give this medicine. Use exactly as directed. Take your medicine at regular intervals. Do not take your medicine more often than directed. A special MedGuide will be given to you by the pharmacist with each prescription and refill. Be sure to read this information carefully each time. It is important that you put your used needles and syringes in a special sharps container. Do not put them in a trash can. If you do not have a sharps container, call your pharmacist or healthcare provider to get one. Talk to your pediatrician regarding the use of this medicine in children. While this drug may be prescribed for children as young as 2 years for selected conditions, precautions do apply. The manufacturer of the medicine offers free information to patients and their health care partners. Call [phone] for more information. Overdosage: If you think you have taken too much of this medicine contact a poison control center or emergency room at once. NOTE: This medicine is only for you. Do not share this medicine with others. What if I miss a dose? If you miss a dose, take it as  soon as you can. If it is almost time for your next dose, take only that dose. Do not take double or extra doses. Give the next dose when your next scheduled dose is due. Call your doctor or health care professional if you are not sure how to handle a missed dose. What may interact with this medicine? Do not take this medicine with any of the  following medications: -abatacept -anakinra -etanercept -infliximab -live virus vaccines -rilonacept This medicine may also interact with the following medications: -vaccines This list may not describe all possible interactions. Give your health care provider a list of all the medicines, herbs, non-prescription drugs, or dietary supplements you use. Also tell them if you smoke, drink alcohol, or use illegal drugs. Some items may interact with your medicine. What should I watch for while using this medicine? Visit your doctor or health care professional for regular checks on your progress. Tell your doctor or healthcare professional if your symptoms do not start to get better or if they get worse. You will be tested for tuberculosis (TB) before you start this medicine. If your doctor prescribes any medicine for TB, you should start taking the TB medicine before starting this medicine. Make sure to finish the full course of TB medicine. Call your doctor or health care professional if you get a cold or other infection while receiving this medicine. Do not treat yourself. This medicine may decrease your body's ability to fight infection. Talk to your doctor about your risk of cancer. You may be more at risk for certain types of cancers if you take this medicine. What side effects may I notice from receiving this medicine? Side effects that you should report to your doctor or health care professional as soon as possible: -allergic reactions like skin rash, itching or hives, swelling of the face, lips, or tongue -breathing problems -changes in vision -chest pain -fever, chills, or any other sign of infection -numbness or tingling -red, scaly patches or raised bumps on the skin -swelling of the ankles -swollen lymph nodes in the neck, underarm, or groin areas -unexplained weight loss -unusual bleeding or bruising -unusually weak or tired Side effects that usually do not require medical  attention (report to your doctor or health care professional if they continue or are bothersome): -headache -nausea -redness, itching, swelling, or bruising at site where injected This list may not describe all possible side effects. Call your doctor for medical advice about side effects. You may report side effects to FDA at 1-800-FDA-1088. Where should I keep my medicine? Keep out of the reach of children. Store in the original container and in the refrigerator between 2 and 8 degrees C (36 and 46 degrees F). Do not freeze. The product may be stored in a cool carrier with an ice pack, if needed. Protect from light. Throw away any unused medicine after the expiration date. NOTE: This sheet is a summary. It may not cover all possible information. If you have questions about this medicine, talk to your doctor, pharmacist, or health care provider.  2018 Elsevier/Gold Standard (2015-07-01 11:11:43)

## 2018-10-24 NOTE — Telephone Encounter (Signed)
Received a fax from [facility] regarding a prior authorization for Humira. Authorization has been APPROVED from 10/23/18 to 12/25/2038.   Will send document to scan center.  Authorization # [id number]   Patient is eligible for copay card.  10:26 AM Dorthula Nettles, CPhT

## 2018-10-25 ENCOUNTER — Telehealth: Payer: Self-pay | Admitting: Pharmacy Technician

## 2018-10-25 NOTE — Telephone Encounter (Signed)
Left message for patient to call me or [facility] back to give her payment information for shipment next week.  2:40 PM Dorthula Nettles, CPhT

## 2018-10-30 MED FILL — HUMIRA PEN 40 MG/0.4ML PNKT: 40 | 28 days supply | Qty: 2 | Fill #0

## 2018-11-15 ENCOUNTER — Telehealth: Payer: Self-pay | Admitting: Rheumatology

## 2018-11-15 NOTE — Telephone Encounter (Signed)
Patient paid $25.00 in cash for FMLA form on 11/15/18 at 10:28am.

## 2018-11-26 ENCOUNTER — Telehealth: Payer: Self-pay | Admitting: Rheumatology

## 2018-11-26 NOTE — Telephone Encounter (Signed)
Left message to advise patient she needs to hold Humira and MTX since she has been given a prescription for Amoxicillin. Patient advised she will need to hold until she finishes the amoxicillin.

## 2018-11-26 NOTE — Telephone Encounter (Signed)
Patient called stating she had a tooth extraction this morning and her dentist prescribed Amoxicillin and Tylenol 3.  Patient requested a return call to let her know if it is okay to take these medications along with her Methotrexate and Humira.

## 2018-11-29 ENCOUNTER — Telehealth: Payer: Self-pay | Admitting: Rheumatology

## 2018-11-29 MED FILL — HUMIRA PEN 40 MG/0.4ML PNKT: 40 | 28 days supply | Qty: 2 | Fill #1

## 2018-11-29 NOTE — Telephone Encounter (Signed)
Patient called requesting to talk to Dr. Corliss Skains regarding her FMLA.  Patient states she was told by her HR department that if she didn't have the forms back by 12/9 she will get fired.  Patient requested a return call ASAP.

## 2018-11-29 NOTE — Telephone Encounter (Signed)
Patient advised per Dr. Corliss Skains we will be unable to fill out her paperwork for the the extended leave. 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

## 2018-11-30 ENCOUNTER — Telehealth: Payer: Self-pay | Admitting: Rheumatology

## 2018-11-30 NOTE — Telephone Encounter (Signed)
Patient left a message yesterday stating that she was talking to you about her FMLA paperwork and the call was disconnected.  CB#[phone].  Thank you.

## 2018-11-30 NOTE — Telephone Encounter (Signed)
Patient advised that Cindy Henderson will have paperwork to fax back to her job stating exactly what it has stated before. Patient verbalized understanding.

## 2019-01-02 NOTE — Telephone Encounter (Signed)
[facility], due to insurance change. Authorization has been submitted to patient's insurance via Cover My Meds. Will update once we receive a response.  Left message for patient to advise shipment will be delayed

## 2019-01-03 MED FILL — HUMIRA PEN 40 MG/0.4ML PNKT: 40 | 30 days supply | Qty: 2 | Fill #2

## 2019-01-03 NOTE — Telephone Encounter (Signed)
Received a fax from [facility] regarding a prior authorization for HUMIRA. Authorization has been APPROVED from 01/02/2019 to 12/25/2038.   Will send document to scan center.  Authorization # [id number]

## 2019-01-04 ENCOUNTER — Telehealth: Payer: Self-pay | Admitting: Rheumatology

## 2019-01-04 DIAGNOSIS — M0579 Rheumatoid arthritis with rheumatoid factor of multiple sites without organ or systems involvement: Secondary | ICD-10-CM

## 2019-01-04 NOTE — Telephone Encounter (Signed)
Patient experiencing swelling with her hands/ feet now that she has been off of her medication for awhile. Patient request rx for Prednisone to be sent in. Also, patient needs refill on MTX, Folic Acid, and Vit D. Sent to [facility] in [place]. Please call with questions.

## 2019-01-07 MED ORDER — METHOTREXATE 2.5 MG PO TABS: 20.0000 mg | ORAL_TABLET | ORAL | 0 refills | Status: DC

## 2019-01-07 MED ORDER — PREDNISONE 5 MG PO TABS: ORAL_TABLET | ORAL | 0 refills | Status: DC

## 2019-01-07 MED ORDER — FOLIC ACID 1 MG PO TABS: 2.0000 mg | ORAL_TABLET | Freq: Every day | ORAL | 2 refills | Status: DC

## 2019-01-07 NOTE — Telephone Encounter (Signed)
Patient advised prescription for prednisone has been sent to the pharmacy. Patient reminded labs need to be done this week.

## 2019-01-07 NOTE — Addendum Note (Signed)
Addended by: Henriette CombsHATTON, Nobuo Nunziata L on: 01/07/2019 03:20 PM   Modules accepted: Orders

## 2019-01-07 NOTE — Telephone Encounter (Signed)
Last Visit: 10/24/18 Next Visit: 01/31/19 Labs: 09/14/18 WNL  Attempted to contact patient and left message for patient to call the office.

## 2019-01-07 NOTE — Addendum Note (Signed)
Addended by: Henriette Combs on: 01/07/2019 01:06 PM   Modules accepted: Orders

## 2019-01-07 NOTE — Telephone Encounter (Signed)
Ok to send in prednisone taper starting at 20 mg and tapering by 5 mg every 4 days. Please advise patient to obtain CBC and CMP ASAP.

## 2019-01-07 NOTE — Telephone Encounter (Signed)
Patient advised she is due for labs. Patient states she has been off medication for the last few weeks due to use of antibiotics from teeth being pulled and being sick. Patient states she is experiencing swelling in her feet and swelling and pain in her hands. Patient states when she wakes up they are very stiff. Patient is on MTX 8 tablets weekly and Humira every 2 weeks. Patient is requesting a prescription for Prednisone. Patient will update labs this week.

## 2019-01-09 ENCOUNTER — Telehealth: Payer: Self-pay | Admitting: Rheumatology

## 2019-01-09 ENCOUNTER — Other Ambulatory Visit: Payer: Self-pay | Admitting: *Deleted

## 2019-01-09 DIAGNOSIS — M0579 Rheumatoid arthritis with rheumatoid factor of multiple sites without organ or systems involvement: Secondary | ICD-10-CM

## 2019-01-09 DIAGNOSIS — E559 Vitamin D deficiency, unspecified: Secondary | ICD-10-CM

## 2019-01-09 DIAGNOSIS — Z79899 Other long term (current) drug therapy: Secondary | ICD-10-CM

## 2019-01-09 NOTE — Telephone Encounter (Signed)
Patient advised lab orders have been faxed.

## 2019-01-09 NOTE — Telephone Encounter (Signed)
Patient called stating she was returning your call regarding her labwork orders that need to be sent to [facility] (formerly known as [facility]).  Patient states she does not know the fax number and has never had to give it before.  Patient is requesting a return call because she is planning to go tomorrow and needs to know they will have them.

## 2019-01-10 ENCOUNTER — Telehealth: Payer: Self-pay | Admitting: Rheumatology

## 2019-01-10 ENCOUNTER — Telehealth: Payer: Self-pay | Admitting: *Deleted

## 2019-01-10 ENCOUNTER — Encounter: Payer: Self-pay | Admitting: Rheumatology

## 2019-01-10 NOTE — Telephone Encounter (Signed)
Results received for review.

## 2019-01-10 NOTE — Telephone Encounter (Signed)
Received lab results drawn on 01/10/19. Results show ALT 44, hematocrit 44.9. MVC 100.0 Neutrophil (Abs) 1.7 Segs 18 and Lymphs 75. Patient is on 8 tabs MTX weekly and Humira every 14 days.  Per Dr. Corliss Skains patient to decrease MTX to 6 tabs per week.   Attempted to contact the patient and left message for patient to call the office.

## 2019-01-10 NOTE — Telephone Encounter (Signed)
Patient left a voicemail to let Dr. Corliss Skains know that she had her labwork done today.

## 2019-01-16 NOTE — Progress Notes (Deleted)
Office Visit Note  Patient: Cindy Henderson             Date of Birth: 1981-11-25           MRN: [id number]             PCP: Lawerance Sabal, PA Referring: Lawerance Sabal, [place] Visit Date: 01/30/2019 Occupation: @GUAROCC @  Subjective:  No chief complaint on file.   History of Present Illness: Cindy Henderson is a 38 y.o. female ***   Activities of Daily Living:  Patient reports morning stiffness for *** {minute/hour:19697}.   Patient {ACTIONS;DENIES/REPORTS:21021675::"Denies"} nocturnal pain.  Difficulty dressing/grooming: {ACTIONS;DENIES/REPORTS:21021675::"Denies"} Difficulty climbing stairs: {ACTIONS;DENIES/REPORTS:21021675::"Denies"} Difficulty getting out of chair: {ACTIONS;DENIES/REPORTS:21021675::"Denies"} Difficulty using hands for taps, buttons, cutlery, and/or writing: {ACTIONS;DENIES/REPORTS:21021675::"Denies"}  No Rheumatology ROS completed.   PMFS History:  Patient Active Problem List   Diagnosis Date Noted  . Smoker 08/15/2018  . Poor dentition 08/15/2018  . Vitamin D deficiency 08/15/2018  . Rheumatoid arthritis involving multiple sites with positive rheumatoid factor (HCC) 08/15/2018  . Primary osteoarthritis/ rheumatoid arthritis of both hands 08/15/2018  . Primary osteoarthritis/ rheumatoid arthritis of both feet 08/15/2018  . Heart murmur 08/15/2018  . Patellar instability of right knee 03/21/2012    Past Medical History:  Diagnosis Date  . Diabetes, gestational   . Heart murmur   . Hypertension   . Low iron   . Rheumatoid arthritis (HCC)     Family History  Problem Relation Age of Onset  . Heart disease Unknown   . Arthritis Unknown   . Cancer Unknown   . Asthma Unknown   . Diabetes Unknown   . Graves' disease Mother   . Heart disease Father   . Diabetes Father    Past Surgical History:  Procedure Laterality Date  . KNEE ARTHROPLASTY     Social History   Social History Narrative  . Not on file    There is no immunization history on file  for this patient.   Objective: Vital Signs: There were no vitals taken for this visit.   Physical Exam   Musculoskeletal Exam: ***  CDAI Exam: CDAI Score: Not documented Patient Global Assessment: Not documented; Provider Global Assessment: Not documented Swollen: Not documented; Tender: Not documented Joint Exam   Not documented   There is currently no information documented on the homunculus. Go to the Rheumatology activity and complete the homunculus joint exam.  Investigation: No additional findings.  Imaging: No results found.  Recent Labs: Lab Results  Component Value Date   WBC 4.9 08/09/2018   HGB 15.4 08/09/2018   PLT CANCELED 08/09/2018   NA 140 08/09/2018   K 4.2 08/09/2018   CL 104 08/09/2018   CO2 28 08/09/2018   GLUCOSE 83 08/09/2018   BUN 7 08/09/2018   CREATININE 0.69 08/09/2018   BILITOT 0.3 08/09/2018   AST 28 08/09/2018   ALT 23 08/09/2018   PROT 6.4 08/09/2018   PROT 6.3 08/09/2018   CALCIUM 9.1 08/09/2018   GFRAA 132 08/09/2018   QFTBGOLDPLUS NEGATIVE 08/09/2018    Speciality Comments: No specialty comments available.  Procedures:  No procedures performed Allergies: Ibuprofen   Assessment / Plan:     Visit Diagnoses: Rheumatoid arthritis involving multiple sites with positive rheumatoid factor (HCC)  High risk medication use - Humira 40 mg sq injections every 14 days, MTX 8 tablets by mouth once weekly, folic acid 2 mg by mouth daily  Primary osteoarthritis/ rheumatoid arthritis of both hands  Primary osteoarthritis/ rheumatoid  arthritis of both feet  Vitamin D deficiency  Poor dentition  Heart murmur  Smoker   Orders: No orders of the defined types were placed in this encounter.  No orders of the defined types were placed in this encounter.   Face-to-face time spent with patient was *** minutes. Greater than 50% of time was spent in counseling and coordination of care.  Follow-Up Instructions: No follow-ups on  file.   Gearldine Bienenstockaylor M Dale, PA-C  Note - This record has been created using Dragon software.  Chart creation errors have been sought, but may not always  have been located. Such creation errors do not reflect on  the standard of medical care.

## 2019-01-29 NOTE — Progress Notes (Deleted)
Office Visit Note  Patient: Cindy Henderson             Date of Birth: [birth year]           MRN: [id number]             PCP: Lawerance Sabal, PA Referring: Lawerance Sabal, [place] Visit Date: 02/08/2019 Occupation: @GUAROCC @  Subjective:  No chief complaint on file.  Humira 40 mg every 14 days (started in October 2019) and MTX 6 tabs weekly (started in August 2019).  Last TB gold negative 08/09/18.  Most recent CBC/CMP showed elevated AST on 01/10/2019 and decreased MTX.   History of Present Illness: Cindy Henderson is a 38 y.o. female ***   Activities of Daily Living:  Patient reports morning stiffness for *** {minute/hour:19697}.   Patient {ACTIONS;DENIES/REPORTS:21021675::"Denies"} nocturnal pain.  Difficulty dressing/grooming: {ACTIONS;DENIES/REPORTS:21021675::"Denies"} Difficulty climbing stairs: {ACTIONS;DENIES/REPORTS:21021675::"Denies"} Difficulty getting out of chair: {ACTIONS;DENIES/REPORTS:21021675::"Denies"} Difficulty using hands for taps, buttons, cutlery, and/or writing: {ACTIONS;DENIES/REPORTS:21021675::"Denies"}  No Rheumatology ROS completed.   PMFS History:  Patient Active Problem List   Diagnosis Date Noted  . Smoker 08/15/2018  . Poor dentition 08/15/2018  . Vitamin D deficiency 08/15/2018  . Rheumatoid arthritis involving multiple sites with positive rheumatoid factor (HCC) 08/15/2018  . Primary osteoarthritis/ rheumatoid arthritis of both hands 08/15/2018  . Primary osteoarthritis/ rheumatoid arthritis of both feet 08/15/2018  . Heart murmur 08/15/2018  . Patellar instability of right knee 03/21/2012    Past Medical History:  Diagnosis Date  . Diabetes, gestational   . Heart murmur   . Hypertension   . Low iron   . Rheumatoid arthritis (HCC)     Family History  Problem Relation Age of Onset  . Heart disease Unknown   . Arthritis Unknown   . Cancer Unknown   . Asthma Unknown   . Diabetes Unknown   . Graves' disease Mother   . Heart disease Father   .  Diabetes Father    Past Surgical History:  Procedure Laterality Date  . KNEE ARTHROPLASTY     Social History   Social History Narrative  . Not on file    There is no immunization history on file for this patient.   Objective: Vital Signs: There were no vitals taken for this visit.   Physical Exam   Musculoskeletal Exam: ***  CDAI Exam: CDAI Score: Not documented Patient Global Assessment: Not documented; Provider Global Assessment: Not documented Swollen: Not documented; Tender: Not documented Joint Exam   Not documented   There is currently no information documented on the homunculus. Go to the Rheumatology activity and complete the homunculus joint exam.  Investigation: No additional findings.  Imaging: No results found.  Recent Labs: Lab Results  Component Value Date   WBC 4.9 08/09/2018   HGB 15.4 08/09/2018   PLT CANCELED 08/09/2018   NA 140 08/09/2018   K 4.2 08/09/2018   CL 104 08/09/2018   CO2 28 08/09/2018   GLUCOSE 83 08/09/2018   BUN 7 08/09/2018   CREATININE 0.69 08/09/2018   BILITOT 0.3 08/09/2018   AST 28 08/09/2018   ALT 23 08/09/2018   PROT 6.4 08/09/2018   PROT 6.3 08/09/2018   CALCIUM 9.1 08/09/2018   GFRAA 132 08/09/2018   QFTBGOLDPLUS NEGATIVE 08/09/2018    Speciality Comments: No specialty comments available.  Procedures:  No procedures performed Allergies: Ibuprofen   Assessment / Plan:     Visit Diagnoses: Rheumatoid arthritis involving multiple sites with positive rheumatoid factor (HCC)  High  risk medication use - Humira, started on MTX 8 tablet once weekly in August 2019  Primary osteoarthritis/ rheumatoid arthritis of both hands  Primary osteoarthritis/ rheumatoid arthritis of both feet  Vitamin D deficiency  Poor dentition  Heart murmur  Smoker - 1/2 ppd x 19 years. Tobacco cessation was discussed.   Orders: No orders of the defined types were placed in this encounter.  No orders of the defined types  were placed in this encounter.   Face-to-face time spent with patient was *** minutes. Greater than 50% of time was spent in counseling and coordination of care.  Follow-Up Instructions: No follow-ups on file.   Gearldine Bienenstock, PA-C  Note - This record has been created using Dragon software.  Chart creation errors have been sought, but may not always  have been located. Such creation errors do not reflect on  the standard of medical care.

## 2019-01-30 ENCOUNTER — Ambulatory Visit: Payer: BLUE CROSS/BLUE SHIELD | Admitting: Physician Assistant

## 2019-02-08 ENCOUNTER — Ambulatory Visit: Payer: Self-pay | Admitting: Physician Assistant

## 2019-02-08 NOTE — Progress Notes (Deleted)
Office Visit Note  Patient: Cindy Henderson             Date of Birth: 09/16/1981           MRN: [id number]             PCP: Lawerance Sabal, PA Referring: Lawerance Sabal, [place] Visit Date: 02/14/2019 Occupation: @GUAROCC @  Subjective:  No chief complaint on file.   History of Present Illness: Cindy Henderson is a 38 y.o. female ***   Activities of Daily Living:  Patient reports morning stiffness for *** {minute/hour:19697}.   Patient {ACTIONS;DENIES/REPORTS:21021675::"Denies"} nocturnal pain.  Difficulty dressing/grooming: {ACTIONS;DENIES/REPORTS:21021675::"Denies"} Difficulty climbing stairs: {ACTIONS;DENIES/REPORTS:21021675::"Denies"} Difficulty getting out of chair: {ACTIONS;DENIES/REPORTS:21021675::"Denies"} Difficulty using hands for taps, buttons, cutlery, and/or writing: {ACTIONS;DENIES/REPORTS:21021675::"Denies"}  No Rheumatology ROS completed.   PMFS History:  Patient Active Problem List   Diagnosis Date Noted  . Smoker 08/15/2018  . Poor dentition 08/15/2018  . Vitamin D deficiency 08/15/2018  . Rheumatoid arthritis involving multiple sites with positive rheumatoid factor (HCC) 08/15/2018  . Primary osteoarthritis/ rheumatoid arthritis of both hands 08/15/2018  . Primary osteoarthritis/ rheumatoid arthritis of both feet 08/15/2018  . Heart murmur 08/15/2018  . Patellar instability of right knee 03/21/2012    Past Medical History:  Diagnosis Date  . Diabetes, gestational   . Heart murmur   . Hypertension   . Low iron   . Rheumatoid arthritis (HCC)     Family History  Problem Relation Age of Onset  . Heart disease Unknown   . Arthritis Unknown   . Cancer Unknown   . Asthma Unknown   . Diabetes Unknown   . Carlosdaniel Grob' disease Mother   . Heart disease Father   . Diabetes Father    Past Surgical History:  Procedure Laterality Date  . KNEE ARTHROPLASTY     Social History   Social History Narrative  . Not on file    There is no immunization history on file  for this patient.   Objective: Vital Signs: There were no vitals taken for this visit.   Physical Exam   Musculoskeletal Exam: ***  CDAI Exam: CDAI Score: Not documented Patient Global Assessment: Not documented; Provider Global Assessment: Not documented Swollen: Not documented; Tender: Not documented Joint Exam   Not documented   There is currently no information documented on the homunculus. Go to the Rheumatology activity and complete the homunculus joint exam.  Investigation: No additional findings.  Imaging: No results found.  Recent Labs: Lab Results  Component Value Date   WBC 4.9 08/09/2018   HGB 15.4 08/09/2018   PLT CANCELED 08/09/2018   NA 140 08/09/2018   K 4.2 08/09/2018   CL 104 08/09/2018   CO2 28 08/09/2018   GLUCOSE 83 08/09/2018   BUN 7 08/09/2018   CREATININE 0.69 08/09/2018   BILITOT 0.3 08/09/2018   AST 28 08/09/2018   ALT 23 08/09/2018   PROT 6.4 08/09/2018   PROT 6.3 08/09/2018   CALCIUM 9.1 08/09/2018   GFRAA 132 08/09/2018   QFTBGOLDPLUS NEGATIVE 08/09/2018    Speciality Comments: No specialty comments available.  Procedures:  No procedures performed Allergies: Ibuprofen   Assessment / Plan:     Visit Diagnoses: No diagnosis found.   Orders: No orders of the defined types were placed in this encounter.  No orders of the defined types were placed in this encounter.   Face-to-face time spent with patient was *** minutes. Greater than 50% of time was spent in counseling and coordination  of care.  Follow-Up Instructions: No follow-ups on file.   Earnestine Mealing, CMA  Note - This record has been created using Editor, commissioning.  Chart creation errors have been sought, but may not always  have been located. Such creation errors do not reflect on  the standard of medical care.

## 2019-02-14 ENCOUNTER — Ambulatory Visit: Payer: Self-pay | Admitting: Physician Assistant

## 2019-02-15 NOTE — Progress Notes (Deleted)
Office Visit Note  Patient: Cindy Henderson             Date of Birth: 05-20-1981           MRN: [id number]             PCP: Lawerance Sabal, PA Referring: Lawerance Sabal, [place] Visit Date: 02/19/2019 Occupation: @GUAROCC @  Subjective:  No chief complaint on file.   History of Present Illness: Cindy Henderson is a 38 y.o. female ***   Activities of Daily Living:  Patient reports morning stiffness for *** {minute/hour:19697}.   Patient {ACTIONS;DENIES/REPORTS:21021675::"Denies"} nocturnal pain.  Difficulty dressing/grooming: {ACTIONS;DENIES/REPORTS:21021675::"Denies"} Difficulty climbing stairs: {ACTIONS;DENIES/REPORTS:21021675::"Denies"} Difficulty getting out of chair: {ACTIONS;DENIES/REPORTS:21021675::"Denies"} Difficulty using hands for taps, buttons, cutlery, and/or writing: {ACTIONS;DENIES/REPORTS:21021675::"Denies"}  No Rheumatology ROS completed.   PMFS History:  Patient Active Problem List   Diagnosis Date Noted  . Smoker 08/15/2018  . Poor dentition 08/15/2018  . Vitamin D deficiency 08/15/2018  . Rheumatoid arthritis involving multiple sites with positive rheumatoid factor (HCC) 08/15/2018  . Primary osteoarthritis/ rheumatoid arthritis of both hands 08/15/2018  . Primary osteoarthritis/ rheumatoid arthritis of both feet 08/15/2018  . Heart murmur 08/15/2018  . Patellar instability of right knee 03/21/2012    Past Medical History:  Diagnosis Date  . Diabetes, gestational   . Heart murmur   . Hypertension   . Low iron   . Rheumatoid arthritis (HCC)     Family History  Problem Relation Age of Onset  . Heart disease Unknown   . Arthritis Unknown   . Cancer Unknown   . Asthma Unknown   . Diabetes Unknown   . Luara Faye' disease Mother   . Heart disease Father   . Diabetes Father    Past Surgical History:  Procedure Laterality Date  . KNEE ARTHROPLASTY     Social History   Social History Narrative  . Not on file    There is no immunization history on file  for this patient.   Objective: Vital Signs: There were no vitals taken for this visit.   Physical Exam   Musculoskeletal Exam: ***  CDAI Exam: CDAI Score: Not documented Patient Global Assessment: Not documented; Provider Global Assessment: Not documented Swollen: Not documented; Tender: Not documented Joint Exam   Not documented   There is currently no information documented on the homunculus. Go to the Rheumatology activity and complete the homunculus joint exam.  Investigation: No additional findings.  Imaging: No results found.  Recent Labs: Lab Results  Component Value Date   WBC 4.9 08/09/2018   HGB 15.4 08/09/2018   PLT CANCELED 08/09/2018   NA 140 08/09/2018   K 4.2 08/09/2018   CL 104 08/09/2018   CO2 28 08/09/2018   GLUCOSE 83 08/09/2018   BUN 7 08/09/2018   CREATININE 0.69 08/09/2018   BILITOT 0.3 08/09/2018   AST 28 08/09/2018   ALT 23 08/09/2018   PROT 6.4 08/09/2018   PROT 6.3 08/09/2018   CALCIUM 9.1 08/09/2018   GFRAA 132 08/09/2018   QFTBGOLDPLUS NEGATIVE 08/09/2018    Speciality Comments: No specialty comments available.  Procedures:  No procedures performed Allergies: Ibuprofen   Assessment / Plan:     Visit Diagnoses: No diagnosis found.   Orders: No orders of the defined types were placed in this encounter.  No orders of the defined types were placed in this encounter.   Face-to-face time spent with patient was *** minutes. Greater than 50% of time was spent in counseling and coordination  of care.  Follow-Up Instructions: No follow-ups on file.   Earnestine Mealing, CMA  Note - This record has been created using Editor, commissioning.  Chart creation errors have been sought, but may not always  have been located. Such creation errors do not reflect on  the standard of medical care.

## 2019-02-19 ENCOUNTER — Telehealth: Payer: Self-pay | Admitting: Pharmacy Technician

## 2019-02-19 ENCOUNTER — Ambulatory Visit: Payer: Self-pay | Admitting: Physician Assistant

## 2019-02-19 NOTE — Telephone Encounter (Signed)
Pharmacy contacted patient for refill on 02/13/2019, and patient stated that she was going to doc tomorrow (2/20)  to see when she should resume injections since she has been sick and has yet to use the two pens that she had when we spoke to her in [place] (typically injects on Wednesdays). Told her we would call back next wed 02/26 to see what she and the doctor decided.  Patient's appointment on 2/20 was rescheduled and patient no showed her 2/25 appointment.  Please advise.

## 2019-02-19 NOTE — Telephone Encounter (Signed)
Patient has a scheduled appointment on February 22, 2019

## 2019-02-22 ENCOUNTER — Encounter: Payer: Self-pay | Admitting: Physician Assistant

## 2019-02-22 ENCOUNTER — Ambulatory Visit: Payer: BLUE CROSS/BLUE SHIELD | Admitting: Physician Assistant

## 2019-02-22 VITALS — BP 143/83 | HR 85 | Resp 14 | Ht 67.0 in | Wt 161.0 lb

## 2019-02-22 DIAGNOSIS — M19071 Primary osteoarthritis, right ankle and foot: Secondary | ICD-10-CM

## 2019-02-22 DIAGNOSIS — M19072 Primary osteoarthritis, left ankle and foot: Secondary | ICD-10-CM

## 2019-02-22 DIAGNOSIS — K089 Disorder of teeth and supporting structures, unspecified: Secondary | ICD-10-CM

## 2019-02-22 DIAGNOSIS — M19041 Primary osteoarthritis, right hand: Secondary | ICD-10-CM

## 2019-02-22 DIAGNOSIS — R42 Dizziness and giddiness: Secondary | ICD-10-CM

## 2019-02-22 DIAGNOSIS — M19042 Primary osteoarthritis, left hand: Secondary | ICD-10-CM

## 2019-02-22 DIAGNOSIS — Z79899 Other long term (current) drug therapy: Secondary | ICD-10-CM

## 2019-02-22 DIAGNOSIS — E559 Vitamin D deficiency, unspecified: Secondary | ICD-10-CM

## 2019-02-22 DIAGNOSIS — R011 Cardiac murmur, unspecified: Secondary | ICD-10-CM

## 2019-02-22 DIAGNOSIS — M0579 Rheumatoid arthritis with rheumatoid factor of multiple sites without organ or systems involvement: Secondary | ICD-10-CM

## 2019-02-22 DIAGNOSIS — F17200 Nicotine dependence, unspecified, uncomplicated: Secondary | ICD-10-CM

## 2019-02-22 NOTE — Patient Instructions (Signed)
Standing Labs We placed an order today for your standing lab work.    Please come back and get your standing labs in 1 month then every 3 months   We have open lab Monday through Friday from 8:30-11:30 AM and 1:30-4:00 PM  at the office of Dr. Pollyann Savoy.   You may experience shorter wait times on Monday and Friday afternoons. The office is located at [address], [place], [place] [address] No appointment is necessary.   Labs are drawn by [facility].  You may receive a bill from [facility] for your lab work.  If you wish to have your labs drawn at another location, please call the office 24 hours in advance to send orders.  If you have any questions regarding directions or hours of operation,  please call [phone].   Just as a reminder please drink plenty of water prior to coming for your lab work. Thanks!

## 2019-02-22 NOTE — Progress Notes (Signed)
Office Visit Note  Patient: Cindy Henderson             Date of Birth: 1981/10/14           MRN: [id number]             PCP: Lawerance Sabal, PA Referring: Lawerance Sabal, [place] Visit Date: 02/22/2019 Occupation: @GUAROCC @  Subjective:  Pain in multiple joints   History of Present Illness: Cindy Henderson is a 38 y.o. female with history of seropositive rheumatoid arthritis and osteoarthritis.  Patient has been off of Humira and methotrexate for about  1.5 months.  She reports that she was having recurrent infections so discontinued her medication.  She reports she has not had any recent infections and is afebrile.  She would like to discuss restarting on Humira and methotrexate.  She reports she is having pain in bilateral hands and bilateral feet.  She notes intermittent swelling in her hands and feet.  She is also been having increased discomfort in bilateral shoulder joints and bilateral elbow joints.  She states the left elbow swells intermittently.  She has occasional knee joint pain. She states that she has a very physically demanding job which exacerbates her joint pain.  She reports she continues to have chronic fatigue. She has been having episodes of nausea and diarrhea, especially after eating.   She reports she has been having episodes of chest pain, dizziness, and her lips tingling.  She has not been evaluated by PCP or ED yet. She reports that during these episodes she feels very anxious.  She reports the frequency of these episodes have been increasing.  The episodes have occurred with exertion and at rest.    Activities of Daily Living:  Patient reports morning stiffness for 24 hours.   Patient Reports nocturnal pain.  Difficulty dressing/grooming: Reports Difficulty climbing stairs: Denies Difficulty getting out of chair: Reports Difficulty using hands for taps, buttons, cutlery, and/or writing: Reports  Review of Systems  Constitutional: Positive for fatigue.  HENT: Negative for  mouth sores, mouth dryness and nose dryness.   Eyes: Negative for pain, itching, visual disturbance and dryness.  Respiratory: Negative for cough, hemoptysis, shortness of breath, wheezing, apnea and difficulty breathing.   Cardiovascular: Positive for chest pain and palpitations. Negative for hypertension.  Gastrointestinal: Positive for diarrhea. Negative for blood in stool and constipation.  Endocrine: Negative for increased urination.  Genitourinary: Negative for painful urination.  Musculoskeletal: Positive for arthralgias, joint pain, joint swelling and morning stiffness. Negative for myalgias, muscle weakness, muscle tenderness and myalgias.  Skin: Negative for color change, pallor, rash, hair loss, nodules/bumps, redness, skin tightness, ulcers and sensitivity to sunlight.  Allergic/Immunologic: Negative for susceptible to infections.  Neurological: Positive for light-headedness and headaches. Negative for dizziness.  Hematological: Negative for swollen glands.  Psychiatric/Behavioral: Positive for sleep disturbance. Negative for depressed mood and confusion. The patient is not nervous/anxious.     PMFS History:  Patient Active Problem List   Diagnosis Date Noted  . Smoker 08/15/2018  . Poor dentition 08/15/2018  . Vitamin D deficiency 08/15/2018  . Rheumatoid arthritis involving multiple sites with positive rheumatoid factor (HCC) 08/15/2018  . Primary osteoarthritis/ rheumatoid arthritis of both hands 08/15/2018  . Primary osteoarthritis/ rheumatoid arthritis of both feet 08/15/2018  . Heart murmur 08/15/2018  . Patellar instability of right knee 03/21/2012    Past Medical History:  Diagnosis Date  . Diabetes, gestational   . Heart murmur   . Hypertension   .  Low iron   . Rheumatoid arthritis (HCC)     Family History  Problem Relation Age of Onset  . Heart disease Other   . Arthritis Other   . Cancer Other   . Asthma Other   . Diabetes Other   . Graves' disease  Mother   . Heart disease Father   . Diabetes Father    Past Surgical History:  Procedure Laterality Date  . KNEE ARTHROPLASTY     Social History   Social History Narrative  . Not on file    There is no immunization history on file for this patient.   Objective: Vital Signs: BP (!) 143/83 (BP Location: Right Arm, Patient Position: Sitting, Cuff Size: Normal)   Pulse 85   Resp 14   Ht  (1.702 m)   Wt 161 lb (73 kg)   BMI 25.22 kg/m    Physical Exam Vitals signs and nursing note reviewed.  Constitutional:      Appearance: She is well-developed.  HENT:     Head: Normocephalic and atraumatic.  Eyes:     Conjunctiva/sclera: Conjunctivae normal.  Neck:     Musculoskeletal: Normal range of motion.  Cardiovascular:     Rate and Rhythm: Normal rate and regular rhythm.     Heart sounds: Murmur present.  Pulmonary:     Effort: Pulmonary effort is normal.     Breath sounds: Wheezing present.  Abdominal:     General: Bowel sounds are normal.     Palpations: Abdomen is soft.  Lymphadenopathy:     Cervical: No cervical adenopathy.  Skin:    General: Skin is warm and dry.     Capillary Refill: Capillary refill takes less than 2 seconds.  Neurological:     Mental Status: She is alert and oriented to person, place, and time.  Psychiatric:        Behavior: Behavior normal.      Musculoskeletal Exam: C-spine, thoracic spine, and lumbar spine good ROM.  No midline spinal tenderness.  No SI joint tenderness.  Shoulder joints painful ROM and tenderness.  Left elbow joint tenderness and synovitis.  She has mild elbow joint contractures-5 degrees bilaterally.  Limited ROM of both wrist joints.  Tenderness of both wrist joints.  Synovitis and tenderness as described below.   Hip joints, knee joints, ankle joints, MTPs, PIPs, and DIPs good ROM with no synovitis.  No warmth or effusion of knee joints. No tenderness or swelling of ankle joints.     CDAI Exam: CDAI Score: 23.4    Patient Global Assessment: 7 (mm); Provider Global Assessment: 7 (mm) Swollen: 4 ; Tender: 18  Joint Exam      Right  Left  Glenohumeral      Tender  Elbow     Swollen Tender  Wrist   Tender   Tender  MCP 1   Tender   Tender  MCP 2   Tender   Tender  MCP 3   Tender   Tender  MCP 4   Tender   Tender  MCP 5   Tender   Tender  PIP 2   Tender  Swollen Tender  PIP 3  Swollen Tender     PIP 4  Swollen Tender        Investigation: No additional findings.  Imaging: No results found.  Recent Labs: Lab Results  Component Value Date   WBC 4.9 08/09/2018   HGB 15.4 08/09/2018   PLT CANCELED 08/09/2018  NA 140 08/09/2018   K 4.2 08/09/2018   CL 104 08/09/2018   CO2 28 08/09/2018   GLUCOSE 83 08/09/2018   BUN 7 08/09/2018   CREATININE 0.69 08/09/2018   BILITOT 0.3 08/09/2018   AST 28 08/09/2018   ALT 23 08/09/2018   PROT 6.4 08/09/2018   PROT 6.3 08/09/2018   CALCIUM 9.1 08/09/2018   GFRAA 132 08/09/2018   QFTBGOLDPLUS NEGATIVE 08/09/2018    Speciality Comments: No specialty comments available.  Procedures:  No procedures performed Allergies: Ibuprofen     Assessment / Plan:     Visit Diagnoses: Rheumatoid arthritis involving multiple sites with positive rheumatoid factor (HCC) - Severe rheumatoid arthritis involving multiple joints: She continues to have active synovitis as described above.  She has pain in both hands, both feet, both elbow joints, and both shoulder joints.  She has been off of Humira and MTX for 1.5 months.  She had recurrent infections and held both medications but she has no signs of infection at this time and would like to resume.  She will start injecting Humira 40 mg subcutaneously once weekly and taking methotrexate 6 tablets by mouth once weekly.  She will return for lab work in 1 month and then every 3 months to monitor for drug toxicity.  Her most recent lab work on 01/10/2019 revealed elevated ALT.  She continues to drink alcohol on a daily  basis.  She was advised to avoid alcohol use.  If LFTs continue to rise we will have to discuss other treatment options.  She was advised to notify us if she develops increased joint pain or joint swelling.  She will follow up in 3 months.  High risk medication use - Humira, MTX, folic acid.  ALT was 44 on 01/10/2019.  She drinks alcohol on a daily basis.  We stressed the importance of avoiding Tylenol and alcohol while on methotrexate.  Patient was advised to decrease her dose of methotrexate from 8 tablets by mouth once weekly to 6 tablets by mouth once weekly.  She has been off of both Humira and methotrexate for 1.5 months.  She will be resuming both medications.  Standing orders are in place.  She return for lab work in 1 month and every 3 months.  Primary osteoarthritis/ rheumatoid arthritis of both hands: She has chronic pian in both hands.  She has active synovitis and tenderness on exam.   Primary osteoarthritis/ rheumatoid arthritis of both feet: She has chronic pain in both feet.  She wears proper fitting shoes.    Vitamin D deficiency: She is no longer taking a vitamin D supplement.  Heart murmur  Poor dentition  Smoker - CXR normal on 08/17/18.  Wheezing was auscultated at the base of the left lung on exam today.  She has been experiencing episodes of dizziness, chest tightness, and tingling in her lips.  She has not been evaluated by her PCP or emergency department.She reports during these episodes she gets anxious.  She denies any shortness of breath.  She has been having some intermittent palpitations during these episodes.  No syncopal episodes.  She was advised to call her PCP today.    Orders: No orders of the defined types were placed in this encounter.  No orders of the defined types were placed in this encounter.   Face-to-face time spent with patient was 30 minutes. Greater than 50% of time was spent in counseling and coordination of care.  Follow-Up Instructions:  Return in about 3  months (around 05/23/2019) for Rheumatoid arthritis, Osteoarthritis.   Gearldine Bienenstockaylor M Dale, PA-C   I examined and evaluated the patient with Sherron Alesaylor Dale PA.  Patient had synovitis in multiple joints as described above.  We had detailed discussion regarding the treatment.  Abstinence from alcohol was discussed at length.  She will resume methotrexate and Humira.  I advised her to see her PCP regarding the new symptoms she has been experiencing.  The plan of care was discussed as noted above.  Pollyann SavoyShaili Deveshwar, MD  Note - This record has been created using Animal [employer]Dragon software.  Chart creation errors have been sought, but may not always  have been located. Such creation errors do not reflect on  the standard of medical care.

## 2019-03-11 ENCOUNTER — Other Ambulatory Visit: Payer: Self-pay | Admitting: Rheumatology

## 2019-03-11 DIAGNOSIS — M0579 Rheumatoid arthritis with rheumatoid factor of multiple sites without organ or systems involvement: Secondary | ICD-10-CM

## 2019-03-11 NOTE — Telephone Encounter (Signed)
Last Visit: 02/22/19 Next Visit: due May 2020. Labs: 01/10/19 Lymphs 75, Neutro Abs. 1.7, MCV 100.0, Hct 44.9, ALT 44 TB Gold: 08/09/18 Neg   Okay to refill per Dr. Corliss Skains

## 2019-03-27 MED FILL — HUMIRA PEN 40 MG/0.4ML PNKT: 40 | 28 days supply | Qty: 2 | Fill #0

## 2019-05-01 MED FILL — HUMIRA PEN 40 MG/0.4ML PNKT: 40 | 28 days supply | Qty: 2 | Fill #1

## 2019-07-17 NOTE — Progress Notes (Signed)
Office Visit Note  Patient: Cindy Henderson             Date of Birth: 1981-04-07           MRN: [id number]             PCP: Lawerance Sabal, PA Referring: Lawerance Sabal, [place] Visit Date: 07/18/2019 Occupation: @GUAROCC @  Subjective:  Pain in multiple joints   History of Present Illness: Cindy Henderson is a 38 y.o. female with history of seropositive rheumatoid arthritis and osteoarthritis.  Patient is on Humira 40 mg every day injections every 14 days, methotrexate 8 tablets by mouth once weekly, folic acid 2 mg by mouth daily.  She has been back on her medications for about 1 month.  She was holding Humira and methotrexate due to having a cough.  She has been afebrile and has had any recent infections.  She continues to have pain in multiple joints including bilateral hands, bilateral shoulder joints, bilateral ankle joints and bilateral feet.  She has intermittent swelling in both ankle joints.  She experiences nocturnal pain which makes it difficult to sleep at night.  She has chronic fatigue related to insomnia.    Activities of Daily Living:  Patient reports morning stiffness for 30-60 minutes.   Patient Reports nocturnal pain.  Difficulty dressing/grooming: Denies Difficulty climbing stairs: Reports Difficulty getting out of chair: Denies Difficulty using hands for taps, buttons, cutlery, and/or writing: Reports  Review of Systems  Constitutional: Positive for fatigue.  HENT: Negative for mouth sores, mouth dryness and nose dryness.   Eyes: Negative for pain, itching, visual disturbance and dryness.  Respiratory: Negative for hemoptysis, shortness of breath, wheezing and difficulty breathing.   Cardiovascular: Negative for chest pain, palpitations, hypertension and swelling in legs/feet.  Gastrointestinal: Positive for diarrhea. Negative for abdominal pain, blood in stool and constipation.  Endocrine: Negative for increased urination.  Genitourinary: Negative for painful urination.   Musculoskeletal: Positive for arthralgias, joint pain, joint swelling and morning stiffness. Negative for myalgias, muscle weakness, muscle tenderness and myalgias.  Skin: Negative for color change, pallor, rash, hair loss, nodules/bumps, redness, skin tightness, ulcers and sensitivity to sunlight.  Allergic/Immunologic: Negative for susceptible to infections.  Neurological: Positive for headaches. Negative for dizziness.  Hematological: Negative for swollen glands.  Psychiatric/Behavioral: Positive for sleep disturbance. Negative for depressed mood and confusion. The patient is not nervous/anxious.     PMFS History:  Patient Active Problem List   Diagnosis Date Noted   Smoker 08/15/2018   Poor dentition 08/15/2018   Vitamin D deficiency 08/15/2018   Rheumatoid arthritis involving multiple sites with positive rheumatoid factor (HCC) 08/15/2018   Primary osteoarthritis/ rheumatoid arthritis of both hands 08/15/2018   Primary osteoarthritis/ rheumatoid arthritis of both feet 08/15/2018   Heart murmur 08/15/2018   Patellar instability of right knee 03/21/2012    Past Medical History:  Diagnosis Date   Diabetes, gestational    Heart murmur    Hypertension    Low iron    Rheumatoid arthritis (HCC)     Family History  Problem Relation Age of Onset   Heart disease Other    Arthritis Other    Cancer Other    Asthma Other    Diabetes Other    Graves' disease Mother    Heart disease Father    Diabetes Father    Past Surgical History:  Procedure Laterality Date   KNEE ARTHROPLASTY     Social History   Social History Narrative  Not on file    There is no immunization history on file for this patient.   Objective: Vital Signs: BP (!) 159/106 (BP Location: Right Arm, Patient Position: Sitting, Cuff Size: Normal)    Pulse 71    Resp 13    Ht 5\' 7"  (1.702 m)    Wt 178 lb 9.6 oz (81 kg)    BMI 27.97 kg/m    Physical Exam Vitals signs and nursing note  reviewed.  Constitutional:      Appearance: She is well-developed.  HENT:     Head: Normocephalic and atraumatic.  Eyes:     Conjunctiva/sclera: Conjunctivae normal.  Neck:     Musculoskeletal: Normal range of motion.  Cardiovascular:     Rate and Rhythm: Normal rate and regular rhythm.     Heart sounds: Normal heart sounds.  Pulmonary:     Effort: Pulmonary effort is normal.     Breath sounds: Normal breath sounds.  Abdominal:     General: Bowel sounds are normal.     Palpations: Abdomen is soft.  Lymphadenopathy:     Cervical: No cervical adenopathy.  Skin:    General: Skin is warm and dry.     Capillary Refill: Capillary refill takes less than 2 seconds.  Neurological:     Mental Status: She is alert and oriented to person, place, and time.  Psychiatric:        Behavior: Behavior normal.      Musculoskeletal Exam: C-spine, thoracic spine, lumbar spine good range of motion.  No midline spinal tenderness.  SI joint tenderness.  Shoulders have painful range of motion with tenderness.  No warmth or effusion of the shoulders were noted on exam.  Bilateral elbow triggering contractures noted.  She has limited range of motion of bilateral wrist joints.  She has tenderness of bilateral wrist joints.  She has tenderness of MCP and PIP joints bilaterally as described below.  Hip joints have good range of motion no discomfort.  Right knee has synovial thickening and warmth.  Left knee has good range of motion with no warmth or effusion.  She has tenderness of bilateral ankle joints.  CDAI Exam: CDAI Score: -- Patient Global: --; Provider Global: -- Swollen: 2 ; Tender: 19  Joint Exam      Right  Left  Glenohumeral   Tender   Tender  Wrist   Tender   Tender  MCP 2   Tender   Tender  MCP 3   Tender   Tender  MCP 4   Tender   Tender  MCP 5      Tender  PIP 2  Swollen Tender   Tender  PIP 3   Tender   Tender  PIP 4  Swollen Tender   Tender  Ankle   Tender   Tender      Investigation: No additional findings.  Imaging: No results found.  Recent Labs: Lab Results  Component Value Date   WBC 4.9 08/09/2018   HGB 15.4 08/09/2018   PLT CANCELED 08/09/2018   NA 140 08/09/2018   K 4.2 08/09/2018   CL 104 08/09/2018   CO2 28 08/09/2018   GLUCOSE 83 08/09/2018   BUN 7 08/09/2018   CREATININE 0.69 08/09/2018   BILITOT 0.3 08/09/2018   AST 28 08/09/2018   ALT 23 08/09/2018   PROT 6.4 08/09/2018   PROT 6.3 08/09/2018   CALCIUM 9.1 08/09/2018   GFRAA 132 08/09/2018   QFTBGOLDPLUS NEGATIVE 08/09/2018  Speciality Comments: No specialty comments available.  Procedures:  No procedures performed Allergies: Ibuprofen   Assessment / Plan:     Visit Diagnoses: Rheumatoid arthritis involving multiple sites with positive rheumatoid factor (HCC) - Severe rheumatoid arthritis involving multiple joints -She continues to have active synovitis.  She has pain in multiple joints including bilateral shoulder joints, bilateral wrist joints, bilateral hands, bilateral ankle joints, and bilateral feet.  She restarted on Humira 40 mg if he has injections every 14 days and methotrexate tablets by mouth once weekly about 1 month ago.  She was holding these medications due to having a chronic cough and being concerned about making the cough worse.  She has not had any recent infections.  She has noticed some improvement since restarting on Humira and methotrexate.  She will continue on his current treatment regimen.  Refill folic acid was sent to the pharmacy today.  She does not need refills of Humira or methotrexate since she held the dose for several months.  We discussed the importance of staying compliant taking these medications.  We explained the risks of recurrent flares and prednisone use.  She is aware that she is to hold Humira and methotrexate if she develops any signs or symptoms of an infection.  We will update lab work today.  She is advised to notify us if  she develops increased joint pain or joint swelling.  She will follow-up in the office in 5 months plan: folic acid (FOLVITE) 1 MG tablet  Association of heart disease with rheumatoid arthritis was discussed. Need to monitor blood pressure, cholesterol, and to exercise 30-60 minutes on daily basis was discussed. Poor dental hygiene can be a predisposing factor for rheumatoid arthritis. Good dental hygiene was discussed.  High risk medication use - Humira 40 mg subcutaneously injections every 14 days, MTX 8 tablets po once weekly, and folic acid 2 mg po daily.  CBC and CMP are drawn on 01/10/2019.  She is due to update lab work today.  She will have lab in October and every 3 months to monitor for toxicity.  TB Gold was run today.  TB Gold was negative on 08/09/2018.  Discussed the importance of holding Humira and methotrexate if she develops any signs or symptoms of infection and to resume once infection has completely cleared.  We discussed the importance of social distancing and following the standard precautions recommended by the CDC.  She will be returning to work next week.  She is apprehensive due to being on Humira and methotrexate.  We did provide a note for her employer stating that she has immunocompromise and to follow the standard precautions recommended by the CDC.- Plan: QuantiFERON-TB Gold Plus, CBC with Differential/Platelet, COMPLETE METABOLIC PANEL WITH GFR  Primary osteoarthritis/ rheumatoid arthritis of both hands -She has chronic pain in bilateral hands.  She has tenderness of MCP and PIP joints as described above.  Trepidation with the strengthening discussed.  Primary osteoarthritis/ rheumatoid arthritis of both feet - Chronic pain.  She has intermittent tenderness and swelling in both ankle joints.  She wears proper fitting shoes.   Vitamin D deficiency -We will check vitamin D level today. Plan: VITAMIN D 25 Hydroxy (Vit-D Deficiency, Fractures)  Smoker - We discussed the  importance of tobacco cessation.   Poor dentition   Heart murmur    Orders: Orders Placed This Encounter  Procedures   QuantiFERON-TB Gold Plus   CBC with Differential/Platelet   COMPLETE METABOLIC PANEL WITH GFR   VITAMIN  D 25 Hydroxy (Vit-D Deficiency, Fractures)   Meds ordered this encounter  Medications   folic acid (FOLVITE) 1 MG tablet    Sig: Take 2 tablets (2 mg total) by mouth daily.    Dispense:  180 tablet    Refill:  3    Face-to-face time spent with patient was 30 minutes. Greater than 50% of time was spent in counseling and coordination of care.  Follow-Up Instructions: Return in about 5 months (around 12/18/2019) for Rheumatoid arthritis.   Gearldine Bienenstockaylor M Tereza Gilham, PA-C  Note - This record has been created using Dragon software.  Chart creation errors have been sought, but may not always  have been located. Such creation errors do not reflect on  the standard of medical care.

## 2019-07-18 ENCOUNTER — Other Ambulatory Visit: Payer: Self-pay

## 2019-07-18 ENCOUNTER — Ambulatory Visit: Payer: BC Managed Care – PPO | Admitting: Physician Assistant

## 2019-07-18 ENCOUNTER — Encounter: Payer: Self-pay | Admitting: Physician Assistant

## 2019-07-18 VITALS — BP 159/106 | HR 71 | Resp 13 | Ht 67.0 in | Wt 178.6 lb

## 2019-07-18 DIAGNOSIS — M19071 Primary osteoarthritis, right ankle and foot: Secondary | ICD-10-CM | POA: Diagnosis not present

## 2019-07-18 DIAGNOSIS — M19041 Primary osteoarthritis, right hand: Secondary | ICD-10-CM | POA: Diagnosis not present

## 2019-07-18 DIAGNOSIS — R011 Cardiac murmur, unspecified: Secondary | ICD-10-CM

## 2019-07-18 DIAGNOSIS — M19042 Primary osteoarthritis, left hand: Secondary | ICD-10-CM

## 2019-07-18 DIAGNOSIS — K089 Disorder of teeth and supporting structures, unspecified: Secondary | ICD-10-CM

## 2019-07-18 DIAGNOSIS — E559 Vitamin D deficiency, unspecified: Secondary | ICD-10-CM

## 2019-07-18 DIAGNOSIS — M0579 Rheumatoid arthritis with rheumatoid factor of multiple sites without organ or systems involvement: Secondary | ICD-10-CM

## 2019-07-18 DIAGNOSIS — M19072 Primary osteoarthritis, left ankle and foot: Secondary | ICD-10-CM

## 2019-07-18 DIAGNOSIS — F17200 Nicotine dependence, unspecified, uncomplicated: Secondary | ICD-10-CM

## 2019-07-18 DIAGNOSIS — Z79899 Other long term (current) drug therapy: Secondary | ICD-10-CM | POA: Diagnosis not present

## 2019-07-18 MED ORDER — FOLIC ACID 1 MG PO TABS: 2.0000 mg | ORAL_TABLET | Freq: Every day | ORAL | 3 refills | Status: DC

## 2019-07-18 NOTE — Patient Instructions (Addendum)
Standing Labs We placed an order today for your standing lab work.    Please come back and get your standing labs in October   We have open lab daily Monday through Thursday from 8:30-12:30 PM and 1:30-4:30 PM and Friday from 8:30-12:30 PM and 1:30 -4:00 PM at the office of Dr. Shaili Deveshwar.   You may experience shorter wait times on Monday and Friday afternoons. The office is located at [address], [address], [place], [place] [address] No appointment is necessary.   Labs are drawn by [facility].  You may receive a bill from [facility] for your lab work.  If you wish to have your labs drawn at another location, please call the office 24 hours in advance to send orders.  If you have any questions regarding directions or hours of operation,  please call [phone].   Just as a reminder please drink plenty of water prior to coming for your lab work. Thanks!   

## 2019-07-19 NOTE — Progress Notes (Signed)
LFTs are elevated.  Please advise patient to reduce MTX to 7 tablets po once weekly.  Please advise patient to avoid NSAIDs, tylenol, and alcohol.  Rest of CMP WNL. CBC WNL.   Vitamin D is within desirable range. Please advise patient to continue on maintenance dose of vitamin D.

## 2019-07-20 LAB — COMPLETE METABOLIC PANEL WITH GFR
AG Ratio: 1.5 (calc) (ref 1.0–2.5)
ALT: 41 U/L — ABNORMAL HIGH (ref 6–29)
AST: 44 U/L — ABNORMAL HIGH (ref 10–30)
Albumin: 4.1 g/dL (ref 3.6–5.1)
Alkaline phosphatase (APISO): 46 U/L (ref 31–125)
BUN: 10 mg/dL (ref 7–25)
CO2: 25 mmol/L (ref 20–32)
Calcium: 9 mg/dL (ref 8.6–10.2)
Chloride: 106 mmol/L (ref 98–110)
Creat: 0.87 mg/dL (ref 0.50–1.10)
GFR, Est African American: 100 mL/min/{1.73_m2} (ref >=60)
GFR, Est Non African American: 86 mL/min/{1.73_m2} (ref >=60)
Globulin: 2.7 g/dL (calc) (ref 1.9–3.7)
Glucose, Bld: 108 mg/dL — ABNORMAL HIGH (ref 65–99)
Potassium: 4.3 mmol/L (ref 3.5–5.3)
Sodium: 138 mmol/L (ref 135–146)
Total Bilirubin: 0.6 mg/dL (ref 0.2–1.2)
Total Protein: 6.8 g/dL (ref 6.1–8.1)

## 2019-07-20 LAB — CBC WITH DIFFERENTIAL/PLATELET
Absolute Monocytes: 527 cells/uL (ref 200–950)
Basophils Absolute: 43 cells/uL (ref 0–200)
Basophils Relative: 0.5 %
Eosinophils Absolute: 102 cells/uL (ref 15–500)
Eosinophils Relative: 1.2 %
HCT: 40.5 % (ref 35.0–45.0)
Hemoglobin: 13.6 g/dL (ref 11.7–15.5)
Lymphs Abs: 1879 cells/uL (ref 850–3900)
MCH: 32.9 pg (ref 27.0–33.0)
MCHC: 33.6 g/dL (ref 32.0–36.0)
MCV: 98.1 fL (ref 80.0–100.0)
MPV: 9.5 fL (ref 7.5–12.5)
Monocytes Relative: 6.2 %
Neutro Abs: 5950 cells/uL (ref 1500–7800)
Neutrophils Relative %: 70 %
Platelets: 202 10*3/uL (ref 140–400)
RBC: 4.13 10*6/uL (ref 3.80–5.10)
RDW: 13 % (ref 11.0–15.0)
Total Lymphocyte: 22.1 %
WBC: 8.5 10*3/uL (ref 3.8–10.8)

## 2019-07-20 LAB — QUANTIFERON-TB GOLD PLUS
Mitogen-NIL: >10 IU/mL
NIL: 0.01 IU/mL
QuantiFERON-TB Gold Plus: NEGATIVE
TB1-NIL: 0 IU/mL
TB2-NIL: 0 IU/mL

## 2019-07-20 LAB — VITAMIN D 25 HYDROXY (VIT D DEFICIENCY, FRACTURES): Vit D, 25-Hydroxy: 33 ng/mL (ref 30–100)

## 2019-07-22 NOTE — Progress Notes (Signed)
TB gold negative

## 2019-08-01 ENCOUNTER — Telehealth: Payer: Self-pay

## 2019-08-01 NOTE — Telephone Encounter (Signed)
FAXED NOTES TO [facility] 

## 2019-08-08 MED FILL — HUMIRA PEN 40 MG/0.4ML PNKT: 40 | 28 days supply | Qty: 2 | Fill #2

## 2019-08-27 ENCOUNTER — Telehealth: Payer: Self-pay | Admitting: Rheumatology

## 2019-08-27 ENCOUNTER — Other Ambulatory Visit: Payer: Self-pay | Admitting: Rheumatology

## 2019-08-27 DIAGNOSIS — M0579 Rheumatoid arthritis with rheumatoid factor of multiple sites without organ or systems involvement: Secondary | ICD-10-CM

## 2019-08-27 NOTE — Telephone Encounter (Signed)
Last Visit: 07/18/19 Next Visit: 12/18/19 Labs: 07/18/19 LFTs are elevated. Rest of CMP WNL. CBC WNL.  TB Gold: 07/18/19 Neg   Okay to refill per Dr. Estanislado Pandy

## 2019-08-27 NOTE — Telephone Encounter (Signed)
Patient advised we would only be able to write a note about her immune system and she will need to get a note from her PCP about her physical fitness and what she can do at work. Patient verbalized understanding.

## 2019-08-27 NOTE — Telephone Encounter (Signed)
We can only write a note about her immune system.  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

## 2019-08-27 NOTE — Telephone Encounter (Addendum)
FYI: Patient calling to let you know her job is requiring a fit for duty form to be signed for her. The last note Cindy Henderson gave patient stating patient has compromised immune system has caused them to ask for further documentation stating patient is able to do her job. Patient will bring form by tomorrow.   FYI: Patient called back to let you know she will have work fax form to [place] to be signed.

## 2019-09-05 ENCOUNTER — Ambulatory Visit: Payer: BC Managed Care – PPO | Admitting: Cardiology

## 2019-09-05 MED FILL — HUMIRA PEN 40 MG/0.4ML PNKT: 40 | 28 days supply | Qty: 2 | Fill #0

## 2019-09-09 ENCOUNTER — Ambulatory Visit: Payer: BC Managed Care – PPO | Admitting: Cardiology

## 2019-09-09 NOTE — Progress Notes (Deleted)
Cardiology Office Note  Date: 09/09/2019   ID: Cindy Henderson, DOB 1981-09-10, MRN [id number]  PCP:  Denny Levy, PA  Consulting Cardiologist: Satira Sark, MD Electrophysiologist:  None   No chief complaint on file.   History of Present Illness: Cindy Henderson is a 38 y.o. female referred for cardiology consultation by Ms. Worley PA-C for the evaluation of heart murmur and also palpitations.  She was seen in the past by the [facility] cardiology practice, Dr. Hamilton Capri as of 2016.  She reportedly had an echocardiogram at that time that showed a structurally normal heart.  Past Medical History:  Diagnosis Date   Diabetes, gestational    Heart murmur    Hypertension    Low iron    Rheumatoid arthritis ([place])     Past Surgical History:  Procedure Laterality Date   KNEE ARTHROPLASTY      Current Outpatient Medications  Medication Sig Dispense Refill   folic acid (FOLVITE) 1 MG tablet Take 2 tablets (2 mg total) by mouth daily. 180 tablet 3   HUMIRA PEN 40 MG/0.4ML PNKT INJECT 40 MG INTO THE SKIN EVERY 14 (FOURTEEN) DAYS. 6 each 0   methotrexate (RHEUMATREX) 2.5 MG tablet Take 8 tablets (20 mg total) by mouth once a week. Caution:Chemotherapy. Protect from light. 32 tablet 0   No current facility-administered medications for this visit.    Allergies:  Ibuprofen   Social History: The patient  reports that she has been smoking cigarettes. She has a 14.25 pack-year smoking history. She has never used smokeless tobacco. She reports current alcohol use of about 6.0 standard drinks of alcohol per week. She reports previous drug use.   Family History: The patient's family history includes Arthritis in an other family member; Asthma in an other family member; Cancer in an other family member; Diabetes in her father and another family member; Berenice Primas' disease in her mother; Heart disease in her father and another family member.   ROS:  Please see the history of present  illness. Otherwise, complete review of systems is positive for {NONE DEFAULTED:18576::"none"}.  All other systems are reviewed and negative.   Physical Exam: VS:  There were no vitals taken for this visit., BMI There is no height or weight on file to calculate BMI.  Wt Readings from Last 3 Encounters:  07/18/19 178 lb 9.6 oz (81 kg)  02/22/19 161 lb (73 kg)  10/24/18 160 lb 12.8 oz (72.9 kg)    General: Patient appears comfortable at rest. HEENT: Conjunctiva and lids normal, oropharynx clear with moist mucosa. Neck: Supple, no elevated JVP or carotid bruits, no thyromegaly. Lungs: Clear to auscultation, nonlabored breathing at rest. Cardiac: Regular rate and rhythm, no S3 or significant systolic murmur, no pericardial rub. Abdomen: Soft, nontender, no hepatomegaly, bowel sounds present, no guarding or rebound. Extremities: No pitting edema, distal pulses 2+. Skin: Warm and dry. Musculoskeletal: No kyphosis. Neuropsychiatric: Alert and oriented x3, affect grossly appropriate.  ECG:  An ECG dated July 2020 was personally reviewed today and demonstrated:  Normal sinus rhythm.  Recent Labwork: 07/18/2019: ALT 41; AST 44; BUN 10; Creat 0.87; Hemoglobin 13.6; Platelets 202; Potassium 4.3; Sodium 138   Other Studies Reviewed Today:  Unable to review prior cardiac testing.  Assessment and Plan:   Medication Adjustments/Labs and Tests Ordered: Current medicines are reviewed at length with the patient today.  Concerns regarding medicines are outlined above.   Tests Ordered: No orders of the defined types were placed in  this encounter.   Medication Changes: No orders of the defined types were placed in this encounter.   Disposition:  Follow up {follow up:15908}  Signed, Jonelle Sidle, MD, [facility] 09/09/2019 9:20 AM    [facility] at [facility] [address], [place], [place] [address] Phone: [phone]; Fax: [phone]

## 2019-09-10 ENCOUNTER — Telehealth: Payer: Self-pay | Admitting: Rheumatology

## 2019-09-10 NOTE — Telephone Encounter (Signed)
Patient called stating her employer [employer] is requesting a note stating that it is okay for her to return to work.  Patient states the last note dated 07/18/19 that Lovena Le wrote stated that "she had a compromised immune system and recommended following the standard precautions recommended by the CDC."  Patient states her PCP is sending a note about her general fitness level and just needs Lovena Le to send a note about her immune system and that patient is okay to work.  Please fax note to [phone]  Attn:  April Lovena Le

## 2019-09-13 ENCOUNTER — Encounter: Payer: Self-pay | Admitting: *Deleted

## 2019-09-13 NOTE — Telephone Encounter (Signed)
Letter written and faxed.

## 2019-09-23 ENCOUNTER — Telehealth: Payer: Self-pay | Admitting: Rheumatology

## 2019-09-23 DIAGNOSIS — M0579 Rheumatoid arthritis with rheumatoid factor of multiple sites without organ or systems involvement: Secondary | ICD-10-CM

## 2019-09-23 MED ORDER — METHOTREXATE 2.5 MG PO TABS: 20.0000 mg | ORAL_TABLET | ORAL | 0 refills | Status: DC

## 2019-09-23 MED ORDER — FOLIC ACID 1 MG PO TABS: 2.0000 mg | ORAL_TABLET | Freq: Every day | ORAL | 3 refills | Status: AC

## 2019-09-23 NOTE — Telephone Encounter (Signed)
Patient left a voicemail requesting prescription refills of Methotrexate and Folic Acid. 

## 2019-09-23 NOTE — Telephone Encounter (Signed)
Last Visit: 07/18/19 Next Visit: 12/18/19 Labs: 07/18/19 LFTs are elevated. Rest of CMP WNL. CBC WNL.   Okay to refill per Dr. Estanislado Pandy

## 2019-10-01 ENCOUNTER — Other Ambulatory Visit: Payer: Self-pay

## 2019-10-01 ENCOUNTER — Ambulatory Visit: Payer: BC Managed Care – PPO | Admitting: Cardiology

## 2019-10-01 ENCOUNTER — Telehealth: Payer: Self-pay | Admitting: Cardiology

## 2019-10-01 ENCOUNTER — Encounter: Payer: Self-pay | Admitting: Cardiology

## 2019-10-01 VITALS — BP 129/72 | HR 82 | Ht 67.0 in | Wt 179.4 lb

## 2019-10-01 DIAGNOSIS — R030 Elevated blood-pressure reading, without diagnosis of hypertension: Secondary | ICD-10-CM

## 2019-10-01 DIAGNOSIS — R002 Palpitations: Secondary | ICD-10-CM | POA: Diagnosis not present

## 2019-10-01 DIAGNOSIS — R011 Cardiac murmur, unspecified: Secondary | ICD-10-CM | POA: Diagnosis not present

## 2019-10-01 DIAGNOSIS — M069 Rheumatoid arthritis, unspecified: Secondary | ICD-10-CM

## 2019-10-01 NOTE — Patient Instructions (Addendum)
Medication Instructions:   Your physician recommends that you continue on your current medications as directed. Please refer to the Current Medication list given to you today.  Labwork:  NONE  Testing/Procedures: Your physician has requested that you have an echocardiogram. Echocardiography is a painless test that uses sound waves to create images of your heart. It provides your doctor with information about the size and shape of your heart and how well your heart's chambers and valves are working. This procedure takes approximately one hour. There are no restrictions for this procedure. Your physician has recommended that you wear an event monitor for 14 days. Event monitors are medical devices that record the heart's electrical activity. Doctors most often [place] these monitors to diagnose arrhythmias. Arrhythmias are problems with the speed or rhythm of the heartbeat. The monitor is a small, portable device. You can wear one while you do your normal daily activities. This is usually used to diagnose what is causing palpitations/syncope (passing out). [employer] will contact you about this monitor after its sent to your home address.   Follow-Up:  Your physician recommends that you schedule a follow-up appointment in: pending.  Any Other Special Instructions Will Be Listed Below (If Applicable).  If you need a refill on your cardiac medications before your next appointment, please call your pharmacy.

## 2019-10-01 NOTE — Telephone Encounter (Signed)
Pre-cert Verification for the following procedure   ° °LONG TERM MONITOR (3-14 DAYS) ° °

## 2019-10-01 NOTE — Progress Notes (Signed)
Cardiology Office Note  Date: 10/01/2019   ID: Cindy Henderson, DOB 03/27/1981, MRN [id number]  PCP:  Lawerance Sabal, PA  Consulting Cardiologist:  Nona Dell, MD Electrophysiologist:  None   Chief Complaint  Patient presents with  . Evaluate heart murmur    History of Present Illness: Cindy Henderson is a 38 y.o. female referred for cardiology consultation by Ms. Worley PA-C for the evaluation of heart murmur.  She presents today for evaluation, also describes intermittent spells.  She states that over the last 6 months she has had approximately 6 episodes of feeling a sense of rapid heartbeat and lightheadedness.  This has not been predictable.  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  She has never had syncope.  She has rheumatology follow-up with Dr. Corliss Skains.  Chart indicates blood pressure elevation during office visit back in July, recorded at 159/106.  Blood pressure subsequently recorded at 132/70 during follow-up office visit with PCP, no therapy initiated although patient enrolled in blood pressure monitoring program.  Blood pressure today is near normal.  She reports being told that she has had a heart murmur since childhood.  She has never had an echocardiogram.  I reviewed her ECG from June.  Past Medical History:  Diagnosis Date  . Diabetes, gestational   . Heart murmur   . Hypertension   . Low iron   . Rheumatoid arthritis [facility])     Past Surgical History:  Procedure Laterality Date  . KNEE ARTHROPLASTY      Current Outpatient Medications  Medication Sig Dispense Refill  . DULoxetine (CYMBALTA) 30 MG capsule Take 30 mg by mouth daily.    . ergocalciferol (VITAMIN D2) 1.25 MG (50000 UT) capsule Take 50,000 Units by mouth once a week.    . folic acid (FOLVITE) 1 MG tablet Take 2 tablets (2 mg total) by mouth daily. 180 tablet 3  . HUMIRA PEN 40 MG/0.4ML PNKT INJECT 40 MG INTO THE SKIN EVERY 14 (FOURTEEN)  DAYS. 6 each 0  . methotrexate (RHEUMATREX) 2.5 MG tablet Take 8 tablets (20 mg total) by mouth once a week. Caution:Chemotherapy. Protect from light. 32 tablet 0   No current facility-administered medications for this visit.    Allergies:  Ibuprofen   Social History: The patient  reports that she has been smoking cigarettes. She has a 14.25 pack-year smoking history. She has never used smokeless tobacco. She reports current alcohol use of about 6.0 standard drinks of alcohol per week. She reports previous drug use.   Family History: The patient's family history includes Arthritis in an other family member; Asthma in an other family member; Cancer in an other family member; Diabetes in her father and another family member; [name] in her mother; Heart disease in her father and another family member.    ROS:  Please see the history of present illness. Otherwise, complete review of systems is positive for none.  All other systems are reviewed and negative.   Physical Exam: VS:  BP 129/72   Pulse 82   Ht 5\' 7"  (1.702 m)   Wt 179 lb 6.4 oz (81.4 kg)   SpO2 99%   BMI 28.10 kg/m , BMI Body mass index is 28.1 kg/m.  Wt Readings from Last 3 Encounters:  10/01/19 179 lb 6.4 oz (81.4 kg)  07/18/19 178 lb 9.6 oz (81 kg)  02/22/19 161 lb (73 kg)    General:  Patient appears comfortable at rest. HEENT: Conjunctiva and lids normal, wearing a mask. Neck: Supple, no elevated JVP or carotid bruits, no thyromegaly. Lungs: Clear to auscultation, nonlabored breathing at rest. Cardiac: Regular rate and rhythm, no S3, soft systolic murmur. Abdomen: Soft, nontender, bowel sounds present. Extremities: No pitting edema, distal pulses 2+. Skin: Warm and dry. Musculoskeletal: No kyphosis. Neuropsychiatric: Alert and oriented x3, affect grossly appropriate.  ECG:  An ECG dated June 2020 was personally reviewed today and demonstrated:  Sinus rhythm.  Recent Labwork: 07/18/2019: ALT 41; AST 44; BUN  10; Creat 0.87; Hemoglobin 13.6; Platelets 202; Potassium 4.3; Sodium 138   Other Studies Reviewed Today:  Chest x-ray 08/17/2018: FINDINGS: The heart size and mediastinal contours are within normal limits. Both lungs are clear. The visualized skeletal structures are unremarkable.  IMPRESSION: Normal chest.  Assessment and Plan:  1.  Systolic heart murmur, likely benign although reportedly present since childhood.  She has never had an echocardiogram.  This will be obtained for further evaluation.  Baseline ECG is normal.  2.  Intermittent spells as described above.  Plan will be to investigate for intermittent tachyarrhythmia.  We will obtain a 14-day ZIO patch.  3.  Intermittent elevation in blood pressure although recent measurements have been more reassuring.  This is being followed by PCP.  4.  Rheumatoid arthritis.  Medication Adjustments/Labs and Tests Ordered: Current medicines are reviewed at length with the patient today.  Concerns regarding medicines are outlined above.   Tests Ordered: Orders Placed This Encounter  Procedures  . LONG TERM MONITOR (3-14 DAYS)  . ECHOCARDIOGRAM COMPLETE    Medication Changes: No orders of the defined types were placed in this encounter.   Disposition:  Follow up test results.  Signed, Satira Sark, MD, [facility] 10/01/2019 3:23 PM    [facility] at [place], [place], [place] [address] Phone: [phone]; Fax: [phone]

## 2019-10-02 ENCOUNTER — Telehealth: Payer: Self-pay | Admitting: Cardiology

## 2019-10-02 NOTE — Telephone Encounter (Signed)
Received telephone call from [facility] [employer].  They have tried to make contact with patient in regards to the monitor and her out of pocket expense. Unable to reach patient. Will try and make 2 more telephone calls.

## 2019-10-03 NOTE — Telephone Encounter (Signed)
noted 

## 2019-10-14 ENCOUNTER — Other Ambulatory Visit (INDEPENDENT_AMBULATORY_CARE_PROVIDER_SITE_OTHER): Payer: BC Managed Care – PPO

## 2019-10-14 ENCOUNTER — Telehealth: Payer: Self-pay | Admitting: Rheumatology

## 2019-10-14 DIAGNOSIS — R002 Palpitations: Secondary | ICD-10-CM

## 2019-10-14 NOTE — Telephone Encounter (Signed)
Luellen Pucker from Dr. Gwynneth Munson office (PCP) called requesting a copy of patient's office visit note on 07/18/19.  Please fax to [phone] Attn:  Luellen Pucker

## 2019-10-14 NOTE — Telephone Encounter (Signed)
Faxed last office note to PCP as requested.

## 2019-10-15 NOTE — Progress Notes (Signed)
Office Visit Note  Patient: Cindy Henderson             Date of Birth: [birth year]           MRN: [id number]             PCP: Denny Levy, PA Referring: Denny Levy, [place] Visit Date: 10/16/2019 Occupation: @GUAROCC @  Subjective:  Pain in multiple joints    History of Present Illness: Cindy Henderson is a 38 y.o. female with history of seropositive rheumatoid arthritis and osteoarthritis.  Patient is on Humira 40 mg subcutaneous injections every 14 days, methotrexate 8 tablets by mouth once weekly, and folic acid 2 mg by mouth daily.  She denies missing any doses recently.  She continues to have pain in bilateral hands, bilateral elbow joints, bilateral shoulder joints.  She states that overall this combination of medications is help with her discomfort but she continues to have significant fatigue.  She states that she recently changed her job roles and her job is slightly less strenuous.  She continues to have to perform overuse activities with bilateral upper extremities.  She will be filing for FMLA.    Activities of Daily Living:  Patient reports morning stiffness for 30 minutes.   Patient Reports nocturnal pain.  Difficulty dressing/grooming: Denies Difficulty climbing stairs: Reports Difficulty getting out of chair: Reports Difficulty using hands for taps, buttons, cutlery, and/or writing: Reports  Review of Systems  Constitutional: Positive for fatigue.  HENT: Positive for mouth dryness (Biotene products). Negative for mouth sores and nose dryness.   Eyes: Negative for pain, visual disturbance and dryness.  Respiratory: Negative for cough, hemoptysis, shortness of breath and difficulty breathing.   Cardiovascular: Positive for palpitations (Wearing heart montior ). Negative for chest pain, hypertension and swelling in legs/feet.  Gastrointestinal: Negative for blood in stool, constipation and diarrhea.  Endocrine: Negative for increased urination.  Genitourinary: Negative for  painful urination.  Musculoskeletal: Positive for arthralgias, joint pain, joint swelling and morning stiffness. Negative for myalgias, muscle weakness, muscle tenderness and myalgias.  Skin: Negative for color change, pallor, rash, hair loss, nodules/bumps, skin tightness, ulcers and sensitivity to sunlight.  Allergic/Immunologic: Negative for susceptible to infections.  Neurological: Negative for dizziness, numbness, headaches and weakness.  Hematological: Negative for swollen glands.  Psychiatric/Behavioral: Positive for depressed mood and sleep disturbance. The patient is nervous/anxious.     PMFS History:  Patient Active Problem List   Diagnosis Date Noted   Smoker 08/15/2018   Poor dentition 08/15/2018   Vitamin D deficiency 08/15/2018   Rheumatoid arthritis involving multiple sites with positive rheumatoid factor ([place]) 08/15/2018   Primary osteoarthritis/ rheumatoid arthritis of both hands 08/15/2018   Primary osteoarthritis/ rheumatoid arthritis of both feet 08/15/2018   Heart murmur 08/15/2018   Patellar instability of right knee 03/21/2012    Past Medical History:  Diagnosis Date   Diabetes, gestational    Heart murmur    Hypertension    Low iron    Rheumatoid arthritis ([place])     Family History  Problem Relation Age of Onset   Heart disease Other    Arthritis Other    Cancer Other    Asthma Other    Diabetes Other    Graves' disease Mother    Heart disease Father    Diabetes Father    Past Surgical History:  Procedure Laterality Date   KNEE ARTHROPLASTY     Social History   Social History Narrative   Not on file  There is no immunization history on file for this patient.   Objective: Vital Signs: BP 120/80 (BP Location: Left Arm, Patient Position: Sitting, Cuff Size: Normal)    Pulse 71    Resp 13    Ht 5\' 7"  (1.702 m)    Wt 180 lb 3.2 oz (81.7 kg)    BMI 28.22 kg/m    Physical Exam Vitals signs and nursing note reviewed.    Constitutional:      Appearance: She is well-developed.  HENT:     Head: Normocephalic and atraumatic.  Eyes:     Conjunctiva/sclera: Conjunctivae normal.  Neck:     Musculoskeletal: Normal range of motion.  Cardiovascular:     Rate and Rhythm: Normal rate and regular rhythm.     Heart sounds: Normal heart sounds.  Pulmonary:     Effort: Pulmonary effort is normal.     Breath sounds: Normal breath sounds.  Abdominal:     General: Bowel sounds are normal.     Palpations: Abdomen is soft.  Lymphadenopathy:     Cervical: No cervical adenopathy.  Skin:    General: Skin is warm and dry.     Capillary Refill: Capillary refill takes less than 2 seconds.  Neurological:     Mental Status: She is alert and oriented to person, place, and time.  Psychiatric:        Behavior: Behavior normal.      Musculoskeletal Exam: C-spine, thoracic spine, and lumbar spine good ROM.  Shoulder joints limited abduction to about 90 degrees bilaterally.  Left elbow warmth and tenderness with flexion contracture.  Right elbow good ROM.  Limited ROM of both wrist joints.  No synovitis of MCP joints.  Tenderness and synovitis of PIP joints as described below. Left hip joint ROM is limited.  Right knee warmth and inflammation noted. Left knee no warmth or effusion.  Warmth and tenderness of the left ankle joint.  Tenderness of the right ankle joint.   CDAI Exam: CDAI Score: 21.2  Patient Global: 6 mm; Provider Global: 6 mm Swollen: 4 ; Tender: 19  Joint Exam      Right  Left  Glenohumeral   Tender   Tender  Elbow   Tender   Tender  Wrist   Tender   Tender  MCP 2   Tender   Tender  MCP 3   Tender   Tender  PIP 2  Swollen Tender   Tender  PIP 3   Tender   Tender  PIP 4  Swollen Tender     Knee  Swollen Tender   Tender  Ankle   Tender  Swollen Tender     Investigation: No additional findings.  Imaging: No results found.  Recent Labs: Lab Results  Component Value Date   WBC 8.5 07/18/2019    HGB 13.6 07/18/2019   PLT 202 07/18/2019   NA 138 07/18/2019   K 4.3 07/18/2019   CL 106 07/18/2019   CO2 25 07/18/2019   GLUCOSE 108 (H) 07/18/2019   BUN 10 07/18/2019   CREATININE 0.87 07/18/2019   BILITOT 0.6 07/18/2019   AST 44 (H) 07/18/2019   ALT 41 (H) 07/18/2019   PROT 6.8 07/18/2019   CALCIUM 9.0 07/18/2019   GFRAA 100 07/18/2019   QFTBGOLDPLUS NEGATIVE 07/18/2019    Speciality Comments: No specialty comments available.  Procedures:  No procedures performed Allergies: Ibuprofen   Assessment / Plan:     Visit Diagnoses: Rheumatoid arthritis involving multiple sites with  positive rheumatoid factor (HCC) - Severe rheumatoid arthritis involving multiple joints: She has tenderness and synovitis of multiple joints as described above. She has chronic pain and stiffness in both hands, both elbow joints, and both shoulders.  She has a very strenuous job and performs overuse activities during 12 hours shifts, which exacerbates her joint pain and inflammation.  She is filling for FMLA, which will hopefully help with her symptoms.  She is currently on Humira 40 mg sq injections every 14 days, MTX 8 tablets po once weekly, and folic acid 2 mg po daily.  We discussed switching her to MTX 0.8 ml sq injections once weekly to increase the efficacy of MTX. We will apply for sq MTX and she will continue on Humira as prescribed. She was advised to notify us if she develops increased joint pain or joint swelling.  She will follow up in 3 months.    High risk medication use -Humira 40 mg every 14 days, methotrexate 8 tablets every 7 days, and folic acid 1 mg 2 tablets daily.  Last TB gold negative 07/18/2019.  Most recent CBC/CMP within normal limits except for elevated AST/ALT on 07/18/2019 and her methotrexate dose was decreased.  Due for CBC/CMP today and will monitor every 3 months. - Plan: CBC with Differential/Platelet, COMPLETE METABOLIC PANEL WITH GFR  Primary osteoarthritis/ rheumatoid  arthritis of both hands: She has chronic pain and stiffness in both hands.  She has complete fist formation bilaterally.  She performs overuse activities at work, which exacerbates her discomfort.  Joint protection and muscle strengthening were discussed.   Primary osteoarthritis/ rheumatoid arthritis of both feet: She has no feet pain at this time.  She has synovial thickening of the right 1st MTP joint.  Warmth and tenderness of the left ankle joint noted.  She wears proper fitting shoes.  She recently changed job roles, and she is no longer having to climbing steps or stand on concrete for prolonged periods of time.   Vitamin D deficiency: She is taking an over-the-counter vitamin D supplement.  Other medical conditions are listed as follows  Poor dentition  Smoker: Smoking cessation was discussed.   Heart murmur: She is currently wearing a heart monitor.  She has intermittent palpitations.  Orders: Orders Placed This Encounter  Procedures   CBC with Differential/Platelet   COMPLETE METABOLIC PANEL WITH GFR   No orders of the defined types were placed in this encounter.   Face-to-face time spent with patient was 30 minutes. Greater than 50% of time was spent in counseling and coordination of care.  Follow-Up Instructions: Return in about 3 months (around 01/16/2020) for Rheumatoid arthritis, Osteoarthritis.   Gearldine Bienenstock, PA-C  Note - This record has been created using Dragon software.  Chart creation errors have been sought, but may not always  have been located. Such creation errors do not reflect on  the standard of medical care.

## 2019-10-16 ENCOUNTER — Other Ambulatory Visit: Payer: Self-pay

## 2019-10-16 ENCOUNTER — Encounter: Payer: Self-pay | Admitting: Physician Assistant

## 2019-10-16 ENCOUNTER — Ambulatory Visit: Payer: BC Managed Care – PPO | Admitting: Physician Assistant

## 2019-10-16 VITALS — BP 120/80 | HR 71 | Resp 13 | Ht 67.0 in | Wt 180.2 lb

## 2019-10-16 DIAGNOSIS — M19071 Primary osteoarthritis, right ankle and foot: Secondary | ICD-10-CM | POA: Diagnosis not present

## 2019-10-16 DIAGNOSIS — M19041 Primary osteoarthritis, right hand: Secondary | ICD-10-CM

## 2019-10-16 DIAGNOSIS — M19072 Primary osteoarthritis, left ankle and foot: Secondary | ICD-10-CM

## 2019-10-16 DIAGNOSIS — R011 Cardiac murmur, unspecified: Secondary | ICD-10-CM

## 2019-10-16 DIAGNOSIS — M19042 Primary osteoarthritis, left hand: Secondary | ICD-10-CM

## 2019-10-16 DIAGNOSIS — K089 Disorder of teeth and supporting structures, unspecified: Secondary | ICD-10-CM

## 2019-10-16 DIAGNOSIS — Z79899 Other long term (current) drug therapy: Secondary | ICD-10-CM | POA: Diagnosis not present

## 2019-10-16 DIAGNOSIS — F17200 Nicotine dependence, unspecified, uncomplicated: Secondary | ICD-10-CM

## 2019-10-16 DIAGNOSIS — M0579 Rheumatoid arthritis with rheumatoid factor of multiple sites without organ or systems involvement: Secondary | ICD-10-CM

## 2019-10-16 DIAGNOSIS — E559 Vitamin D deficiency, unspecified: Secondary | ICD-10-CM

## 2019-10-16 MED ORDER — METHOTREXATE SODIUM CHEMO INJECTION 50 MG/2ML: 17.5000 mg | INTRAMUSCULAR | 0 refills | Status: DC

## 2019-10-16 MED ORDER — "TUBERCULIN SYRINGE 27G X 1/2"" 1 ML MISC": 12.0000 | 3 refills | Status: AC

## 2019-10-16 MED FILL — HUMIRA PEN 40 MG/0.4ML PNKT: 40 | 28 days supply | Qty: 2 | Fill #1

## 2019-10-16 NOTE — Progress Notes (Signed)
Pharmacy Note  Subjective:  Patient presents today to the [facility] to see Dr. Estanislado Pandy.   Patient seen by the pharmacist for counseling on injectable methotrexate.  She is currently on Humira and oral methotrexate.  She has a history of elevated LFT's while on methotrexate.  Objective: Current Outpatient Medications on File Prior to Visit  Medication Sig Dispense Refill  . DULoxetine (CYMBALTA) 30 MG capsule Take 30 mg by mouth daily.    . ergocalciferol (VITAMIN D2) 1.25 MG (50000 UT) capsule Take 50,000 Units by mouth once a week.    . folic acid (FOLVITE) 1 MG tablet Take 2 tablets (2 mg total) by mouth daily. 180 tablet 3  . HUMIRA PEN 40 MG/0.4ML PNKT INJECT 40 MG INTO THE SKIN EVERY 14 (FOURTEEN) DAYS. 6 each 0  . lisinopril (ZESTRIL) 10 MG tablet Take 10 mg by mouth daily.    . methotrexate (RHEUMATREX) 2.5 MG tablet Take 8 tablets (20 mg total) by mouth once a week. Caution:Chemotherapy. Protect from light. 32 tablet 0   No current facility-administered medications on file prior to visit.      Assessment/Plan:  Educated patient on how to use a vial and syringe and reviewed injection technique with patient.  Patient was able to demonstrate proper technique for injections using vial and syringe.  Provided patient educational material regarding injection technique and storage of methotrexate.    Her dose will be 0.7 ml every 7 days along with folic acid 2 mg daily.  Prescription sent to [facility] in [place] per patient request.  All questions encouraged and answered.  Instructed patient to call with any questions or concerns.  Mariella Saa, PharmD, [place], [place] Clinical Specialty Pharmacist [phone]  10/16/2019 9:40 AM

## 2019-10-16 NOTE — Patient Instructions (Signed)
Standing Labs We placed an order today for your standing lab work.    Please come back and get your standing labs in January and then every 3 months.  We have open lab daily Monday through Thursday from 8:30-12:30 PM and 1:30-4:30 PM and Friday from 8:30-12:30 PM and 1:30-4:00 PM at the office of Dr. Bo Merino.   You may experience shorter wait times on Monday and Friday afternoons. The office is located at [address] [place], [place], [place] [address] No appointment is necessary.   Labs are drawn by [facility].  You may receive a bill from [facility] for your lab work.  If you wish to have your labs drawn at another location, please call the office 24 hours in advance to send orders.  If you have any questions regarding directions or hours of operation,  please call [phone].   Just as a reminder please drink plenty of water prior to coming for your lab work. Thanks!  Vaccines You are taking a medication(s) that can suppress your immune system.  The following immunizations are recommended: . Flu annually . Pneumonia (Pneumovax 23 and Prevnar 13 spaced at least 1 year apart) . Shingrix  Please check with your PCP to make sure you are up to date.  In addition to staying up to date on lab work and vaccines it is important to:  Marland Kitchen Yearly skin checks with dermatologist due to increase risk in skin cancer with TNF inhibitors .

## 2019-10-17 LAB — COMPLETE METABOLIC PANEL WITH GFR
AG Ratio: 1.6 (calc) (ref 1.0–2.5)
ALT: 24 U/L (ref 6–29)
AST: 27 U/L (ref 10–30)
Albumin: 4.3 g/dL (ref 3.6–5.1)
Alkaline phosphatase (APISO): 47 U/L (ref 31–125)
BUN: 13 mg/dL (ref 7–25)
CO2: 27 mmol/L (ref 20–32)
Calcium: 9.4 mg/dL (ref 8.6–10.2)
Chloride: 106 mmol/L (ref 98–110)
Creat: 0.84 mg/dL (ref 0.50–1.10)
GFR, Est African American: 104 mL/min/{1.73_m2} (ref >=60)
GFR, Est Non African American: 89 mL/min/{1.73_m2} (ref >=60)
Globulin: 2.7 g/dL (calc) (ref 1.9–3.7)
Glucose, Bld: 80 mg/dL (ref 65–99)
Potassium: 5 mmol/L (ref 3.5–5.3)
Sodium: 141 mmol/L (ref 135–146)
Total Bilirubin: 0.3 mg/dL (ref 0.2–1.2)
Total Protein: 7 g/dL (ref 6.1–8.1)

## 2019-10-17 LAB — CBC WITH DIFFERENTIAL/PLATELET
Absolute Monocytes: 410 cells/uL (ref 200–950)
Basophils Absolute: 50 cells/uL (ref 0–200)
Basophils Relative: 0.8 %
Eosinophils Absolute: 132 cells/uL (ref 15–500)
Eosinophils Relative: 2.1 %
HCT: 42.5 % (ref 35.0–45.0)
Hemoglobin: 14.3 g/dL (ref 11.7–15.5)
Lymphs Abs: 3068 cells/uL (ref 850–3900)
MCH: 33.3 pg — ABNORMAL HIGH (ref 27.0–33.0)
MCHC: 33.6 g/dL (ref 32.0–36.0)
MCV: 98.8 fL (ref 80.0–100.0)
MPV: 9.4 fL (ref 7.5–12.5)
Monocytes Relative: 6.5 %
Neutro Abs: 2640 cells/uL (ref 1500–7800)
Neutrophils Relative %: 41.9 %
Platelets: 236 10*3/uL (ref 140–400)
RBC: 4.3 10*6/uL (ref 3.80–5.10)
RDW: 13.4 % (ref 11.0–15.0)
Total Lymphocyte: 48.7 %
WBC: 6.3 10*3/uL (ref 3.8–10.8)

## 2019-10-17 NOTE — Progress Notes (Signed)
CBC and CMP WNL

## 2019-10-24 ENCOUNTER — Telehealth: Payer: Self-pay | Admitting: Rheumatology

## 2019-10-24 ENCOUNTER — Other Ambulatory Visit: Payer: BC Managed Care – PPO

## 2019-10-24 ENCOUNTER — Telehealth: Payer: Self-pay | Admitting: *Deleted

## 2019-10-24 NOTE — Telephone Encounter (Signed)
Patient advised we do not have her FMLA paper work here in our office. Patient advised to contact [place].

## 2019-10-24 NOTE — Telephone Encounter (Signed)
Patient called requesting a return call regarding her FMLA paperwork.  Patient states [employer] told her they sent the paperwork for Dr. Arlean Hopping signature to the office.  Patient states her employer needs the paperwork filled out and sent to by tomorrow 10/25/19 or she will have to start the process again.

## 2019-10-24 NOTE — Telephone Encounter (Signed)
Received call from [place] in regards to monitor.  Patient was supposed to be wearing x 2 weeks, but was only able to keep on a week or so.  Could not get the patches to stay on her skin.  [facility] stated that they will send the results they have to see if that is sufficient.  If not, provider / nurse may let them know & they will send her out a whole new monitor.

## 2019-11-04 ENCOUNTER — Telehealth: Payer: Self-pay | Admitting: *Deleted

## 2019-11-04 IMAGING — DX DG CHEST 2V
2 series · 2 of 2 positions shown · non-contrast
Comparison: Chest x-ray dated June 22, 2018.

CLINICAL DATA: Chronic cough. Current smoker. Recently diagnosed
with rheumatoid arthritis and beginning immunosuppressive therapy.

EXAM:
CHEST - 2 VIEW

[chest pa]
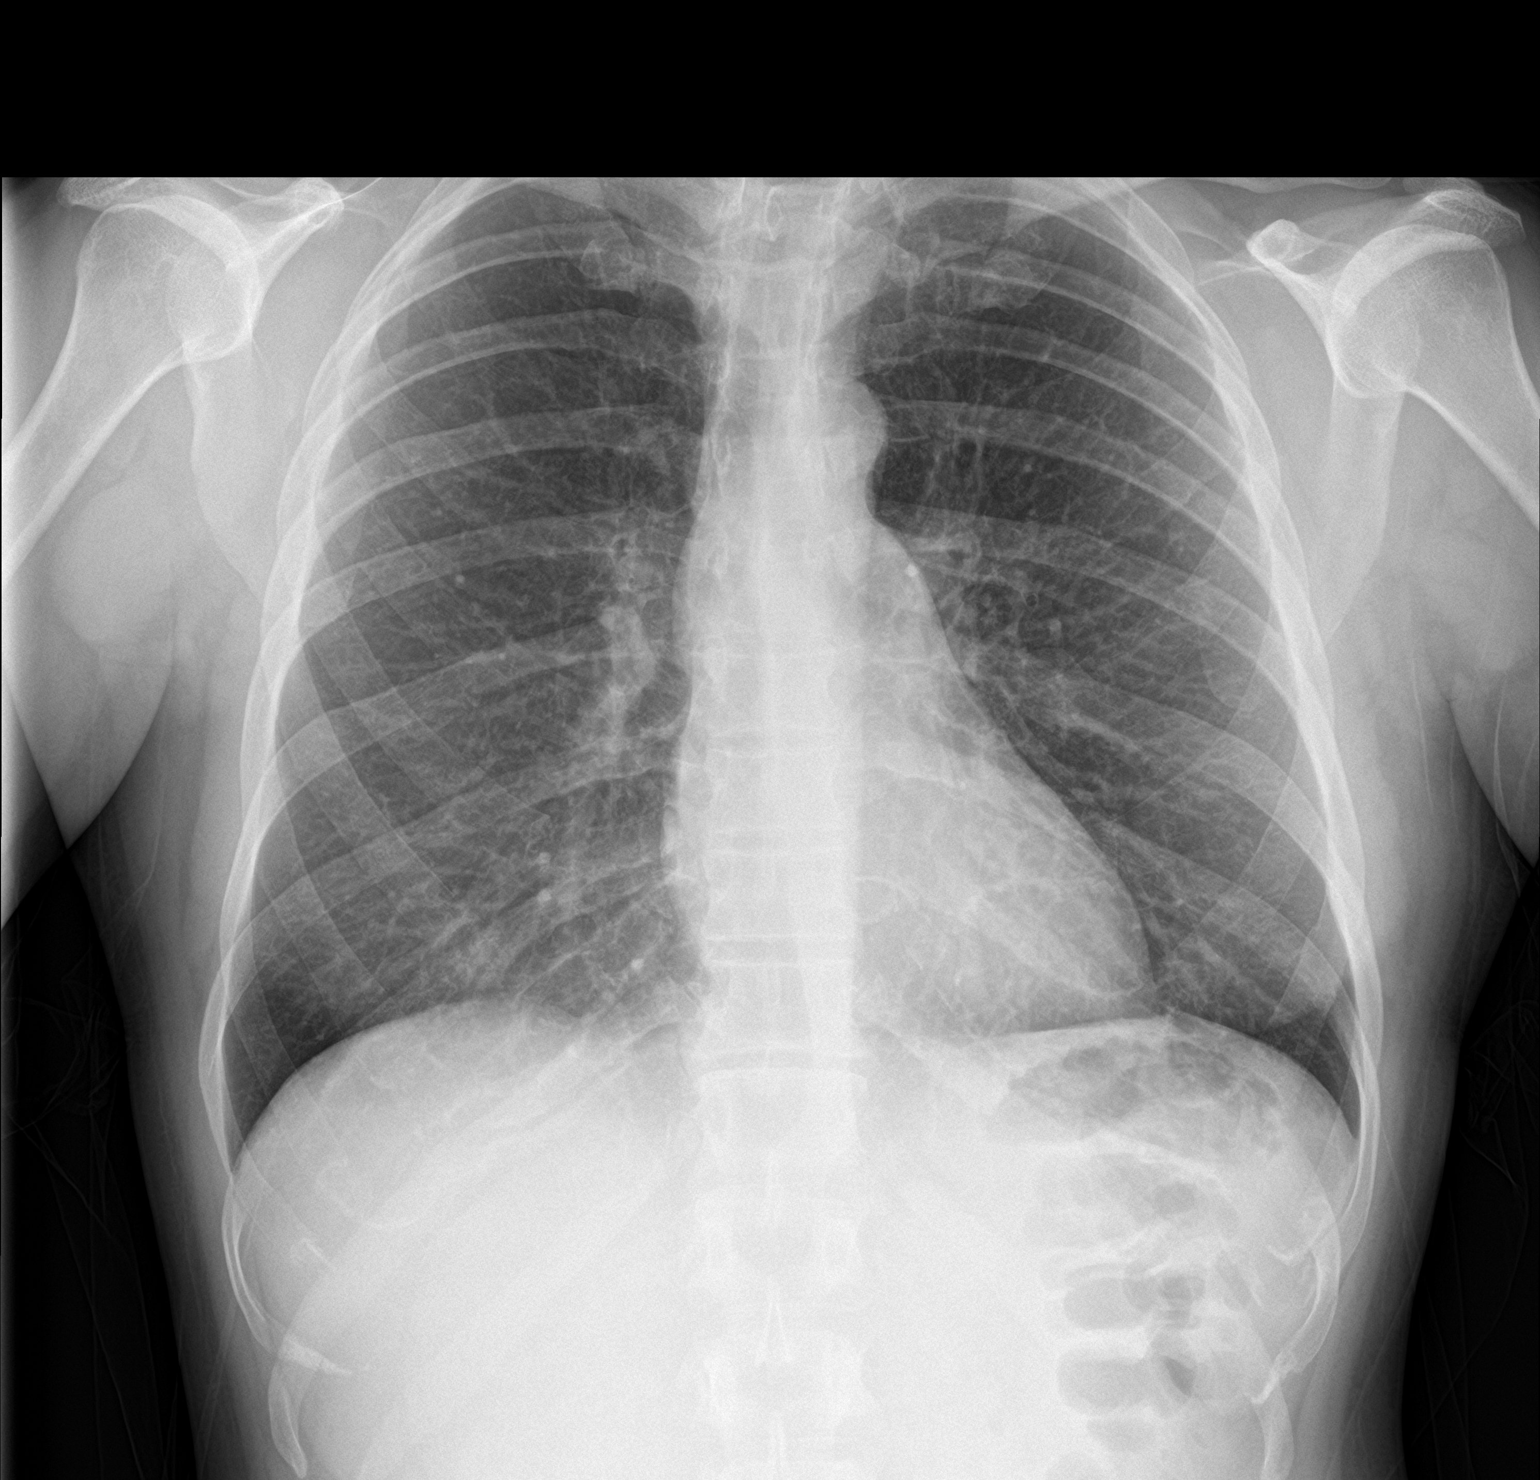

[chest lat]
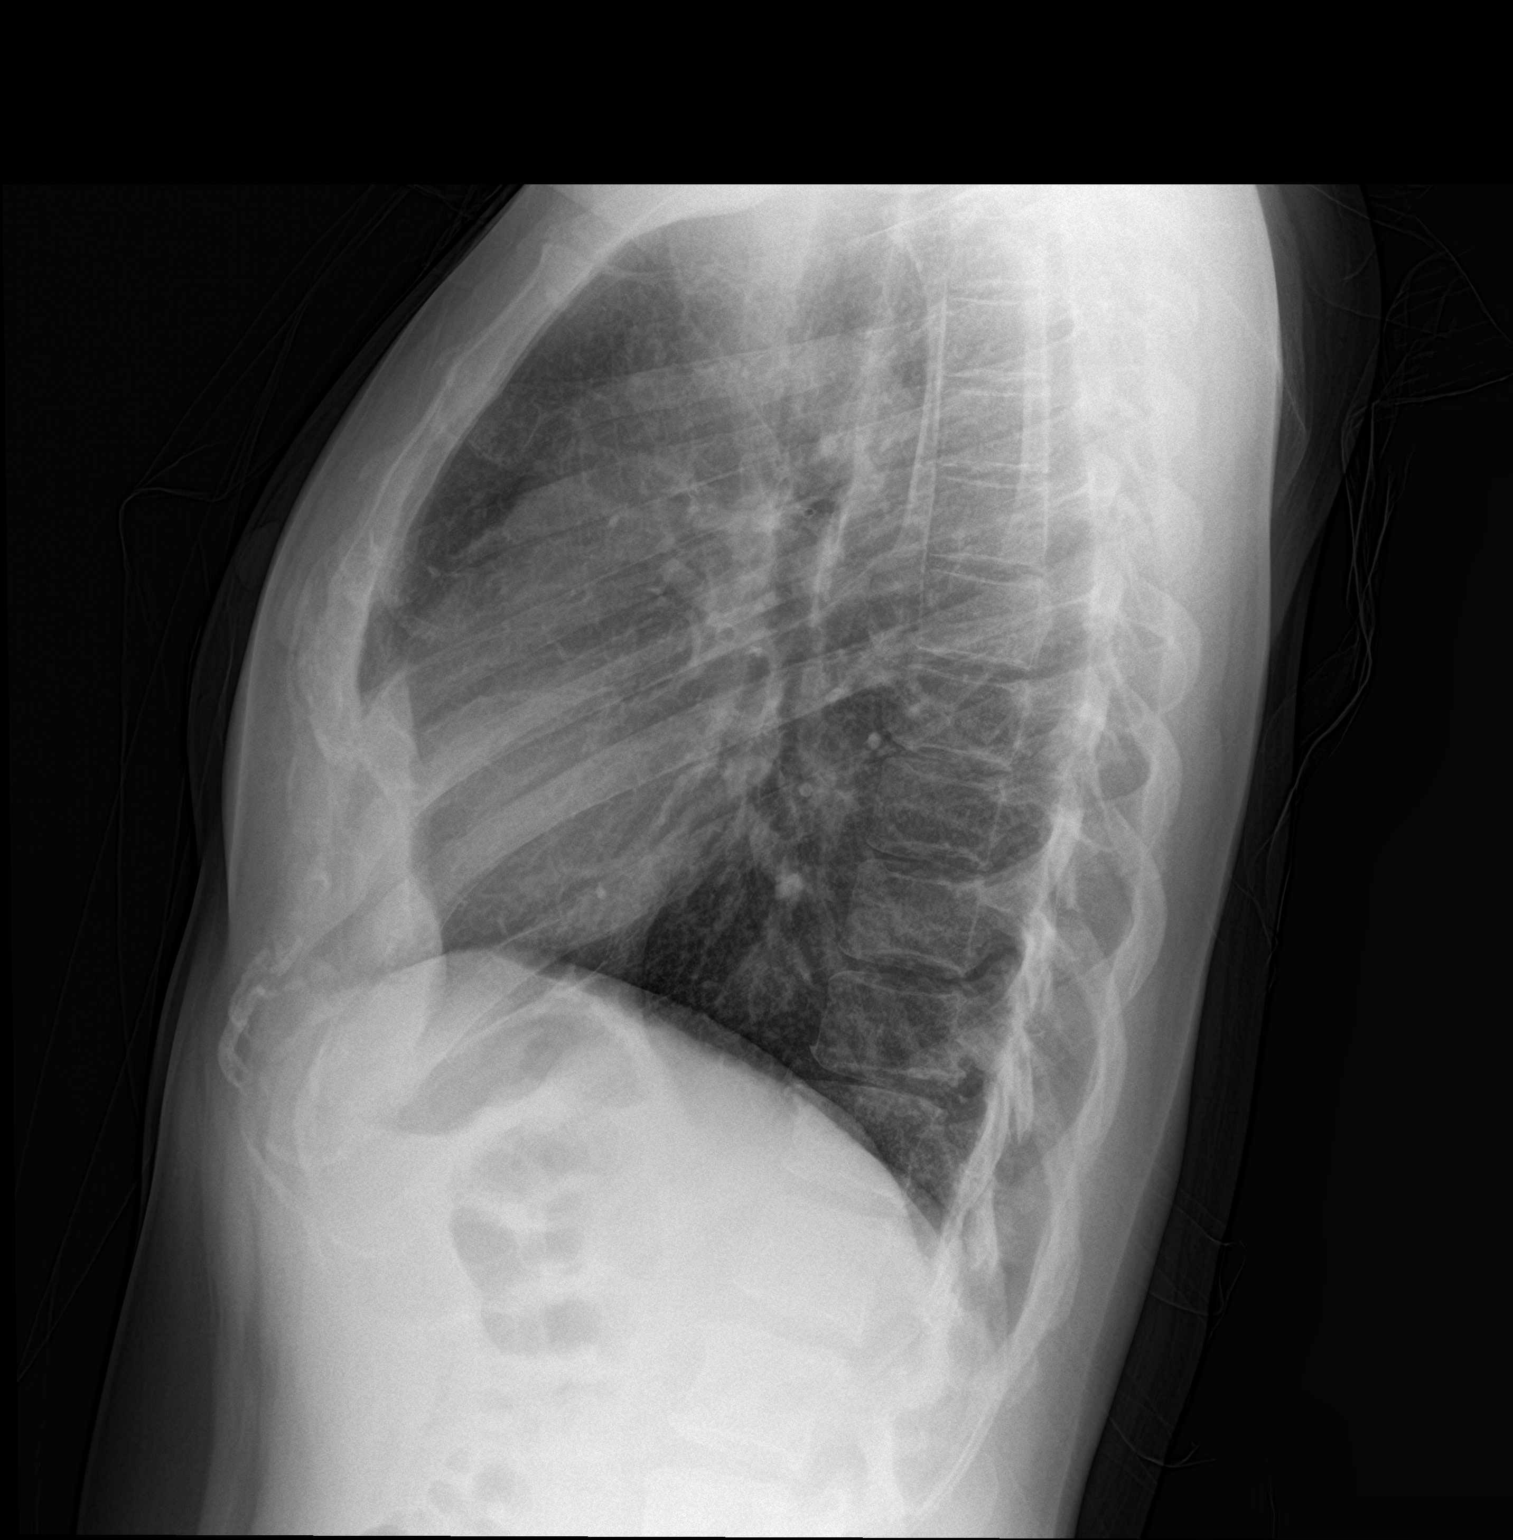

[2 of 2 positions shown; findings below may reference images not displayed]

FINDINGS: The heart size and mediastinal contours are within normal limits.
Both lungs are clear. The visualized skeletal structures are
unremarkable.
IMPRESSION: Normal chest.

## 2019-11-04 NOTE — Telephone Encounter (Signed)
-----   Message from Satira Sark, MD sent at 11/04/2019  8:13 AM EST ----- Results reviewed.  Please let her know that the cardiac monitor did not demonstrate any obvious heart rhythm changes, she is in normal rhythm with average heart rate in the 80s.

## 2019-11-04 NOTE — Telephone Encounter (Signed)
Patient informed. Copy sent to PCP °

## 2019-11-14 MED FILL — HUMIRA PEN 40 MG/0.4ML PNKT: 40 | 28 days supply | Qty: 2 | Fill #2

## 2019-11-19 ENCOUNTER — Other Ambulatory Visit: Payer: BC Managed Care – PPO

## 2019-11-27 ENCOUNTER — Other Ambulatory Visit: Payer: BC Managed Care – PPO

## 2019-12-09 ENCOUNTER — Telehealth: Payer: Self-pay | Admitting: Rheumatology

## 2019-12-09 DIAGNOSIS — M0579 Rheumatoid arthritis with rheumatoid factor of multiple sites without organ or systems involvement: Secondary | ICD-10-CM

## 2019-12-09 MED ORDER — METHOTREXATE SODIUM CHEMO INJECTION 50 MG/2ML: 17.5000 mg | INTRAMUSCULAR | 0 refills | Status: AC

## 2019-12-09 NOTE — Telephone Encounter (Signed)
Patient called requesting prescription refill of Methotrexate to be sent to [facility] in [place].

## 2019-12-09 NOTE — Telephone Encounter (Signed)
Last Visit: 10/16/2019 Next Visit: 01/14/2020 Labs: 10/16/2019 CBC and CMP WNL  Okay to refill per Dr. Estanislado Pandy.

## 2019-12-10 ENCOUNTER — Encounter: Payer: Self-pay | Admitting: *Deleted

## 2019-12-11 ENCOUNTER — Telehealth: Payer: Self-pay | Admitting: *Deleted

## 2019-12-11 ENCOUNTER — Ambulatory Visit: Payer: BC Managed Care – PPO | Admitting: Diagnostic Neuroimaging

## 2019-12-11 ENCOUNTER — Encounter: Payer: Self-pay | Admitting: Diagnostic Neuroimaging

## 2019-12-11 NOTE — Telephone Encounter (Signed)
Patient was no show for new patient appointment today. 

## 2019-12-12 ENCOUNTER — Other Ambulatory Visit: Payer: Self-pay | Admitting: Rheumatology

## 2019-12-12 ENCOUNTER — Telehealth: Payer: Self-pay | Admitting: Rheumatology

## 2019-12-12 ENCOUNTER — Other Ambulatory Visit: Payer: BC Managed Care – PPO

## 2019-12-12 DIAGNOSIS — M0579 Rheumatoid arthritis with rheumatoid factor of multiple sites without organ or systems involvement: Secondary | ICD-10-CM

## 2019-12-12 MED FILL — HUMIRA PEN 40 MG/0.4ML PNKT: 40 | 28 days supply | Qty: 2 | Fill #0

## 2019-12-12 NOTE — Telephone Encounter (Signed)
Refill of Humira sent to [facility]. Advised patient to contact the pharmacy so she can pick up before the weekend. Patient verbalized understanding.

## 2019-12-12 NOTE — Telephone Encounter (Signed)
Last Visit: 10/16/2019 Next Visit: 01/14/2020 Labs: 10/16/2019 CBC and CMP WNL TB Gold: 07/18/2019 negative   Okay to refill per Dr. Estanislado Pandy.

## 2019-12-12 NOTE — Telephone Encounter (Signed)
Patient needs a refill on Humira from [facility]. Patient is out, and due on Sunday.

## 2019-12-18 ENCOUNTER — Ambulatory Visit: Payer: BC Managed Care – PPO | Admitting: Physician Assistant

## 2020-01-13 NOTE — Progress Notes (Deleted)
Virtual Visit via Video Note  I connected with Cindy Henderson on 01/13/20 at  9:30 AM EST by a video enabled telemedicine application and verified that I am speaking with the correct person using two identifiers.  Location: Patient: Home Provider: Clinic  This service was conducted via virtual visit.  Both audio and visual tools were used.  The patient was located at home. I was located in my office.  Consent was obtained prior to the virtual visit and is aware of possible charges through their insurance for this visit.  The patient is an established patient.  Dr. Corliss Skains, MD conducted the virtual visit and Sherron Ales, PA-C acted as scribe during the service.  Office staff helped with scheduling follow up visits after the service was conducted.   I discussed the limitations of evaluation and management by telemedicine and the availability of in person appointments. The patient expressed understanding and agreed to proceed. CC: History of Present Illness: Patient is a 39 year old female with a past medical history of    Review of Systems  Constitutional: Negative for fever and malaise/fatigue.  Eyes: Negative for photophobia, pain, discharge and redness.  Respiratory: Negative for cough, shortness of breath and wheezing.   Cardiovascular: Negative for chest pain and palpitations.  Gastrointestinal: Negative for blood in stool, constipation and diarrhea.  Genitourinary: Negative for dysuria.  Musculoskeletal: Negative for back pain, joint pain, myalgias and neck pain.  Skin: Negative for rash.  Neurological: Negative for dizziness and headaches.  Psychiatric/Behavioral: Negative for depression. The patient is not nervous/anxious and does not have insomnia.       Observations/Objective: Physical Exam  Constitutional: She is oriented to person, place, and time and well-developed, well-nourished, and in no distress.  HENT:  Head: Normocephalic and atraumatic.  Eyes: Conjunctivae are  normal.  Pulmonary/Chest: Effort normal.  Neurological: She is alert and oriented to person, place, and time.  Psychiatric: Mood, memory, affect and judgment normal.    Patient reports morning stiffness for *** {minute/hour:19697}.   Patient {Actions; denies-reports:120008} nocturnal pain.  Difficulty dressing/grooming: {ACTIONS;DENIES/REPORTS:21021675::"Denies"} Difficulty climbing stairs: {ACTIONS;DENIES/REPORTS:21021675::"Denies"} Difficulty getting out of chair: {ACTIONS;DENIES/REPORTS:21021675::"Denies"} Difficulty using hands for taps, buttons, cutlery, and/or writing: {ACTIONS;DENIES/REPORTS:21021675::"Denies"}  Assessment and Plan: Visit Diagnoses: Rheumatoid arthritis involving multiple sites with positive rheumatoid factor (HCC) - Severe rheumatoid arthritis involving multiple joints:  High risk medication use -Humira 40 mg every 14 days, methotrexate 8 tablets every 7 days, and folic acid 1 mg 2 tablets daily.  Last TB gold negative 07/18/2019.    Primary osteoarthritis/ rheumatoid arthritis of both hands:   Primary osteoarthritis/ rheumatoid arthritis of both feet:   Vitamin D deficiency: She is taking an over-the-counter vitamin D supplement.  Other medical conditions are listed as follows  Poor dentition  Smoker: Smoking cessation was discussed.   Heart murmur: She is currently wearing a heart monitor.  She has intermittent palpitations.  Follow Up Instructions: She will follow up in    I discussed the assessment and treatment plan with the patient. The patient was provided an opportunity to ask questions and all were answered. The patient agreed with the plan and demonstrated an understanding of the instructions.   The patient was advised to call back or seek an in-person evaluation if the symptoms worsen or if the condition fails to improve as anticipated.  I provided *** minutes of non-face-to-face time during this encounter.   Gearldine Bienenstock,  PA-C

## 2020-01-14 ENCOUNTER — Other Ambulatory Visit: Payer: Self-pay

## 2020-01-14 ENCOUNTER — Telehealth: Payer: Self-pay | Admitting: Rheumatology

## 2020-01-21 ENCOUNTER — Telehealth: Payer: Self-pay | Admitting: *Deleted

## 2020-01-21 NOTE — Telephone Encounter (Signed)
I called patient, patient lost her job and doesn't have insurance. Patient is applying for assistance and will follow up.

## 2020-02-03 ENCOUNTER — Telehealth: Payer: Self-pay | Admitting: *Deleted

## 2020-02-03 NOTE — Telephone Encounter (Signed)
-----   Message from Eustace Moore, RN sent at 10/01/2019  3:31 PM EDT ----- Regarding: FOLLOW UP/ECHO/10/24/2019/[facility]

## 2020-02-03 NOTE — Telephone Encounter (Signed)
Echo has been scheduled 5 times with 5 no shows per Vicky.

## 2021-09-23 ENCOUNTER — Encounter: Payer: Self-pay | Admitting: *Deleted

## 2021-09-23 NOTE — Progress Notes (Deleted)
Cardiology Office Note  Date: 09/23/2021   ID: Cindy Henderson, DOB 13-Apr-1981, MRN [id number]  PCP:  Lawerance Sabal, PA  Cardiologist:  Nona Dell, MD Electrophysiologist:  None   Chief Complaint:  has strong family hx of cardiovascular disease, current smoker, has RA and intermittent chest pains. May need stress test. Previously seen in 2020 by Dr Diona Browner -- RECORDS IN [place] //[facility] Required-ekg request from pcp 09/23/21  History of Present Illness: Cindy Henderson is a 40 y.o. female with a history of rheumatoid arthritis, primary osteoarthritis, vitamin D deficiency, smoker, heart murmur, hypertension, gestational diabetes, syncopal episodes, low iron, cardiac murmur.  She was last seen by Dr. Diona Browner 10/01/2019 for heart murmur, palpitations, elevated blood pressure without diagnosis of hypertension, rheumatoid arthritis.  She was being seen for heart murmur.  She stated over the prior 6 months she had experienced approximately 6 episodes of feeling a sense of rapid heartbeat and lightheadedness.  She denied ever having syncope.  History of rheumatology following with Dr. Corliss Skains.  She had elevated blood pressures in July of that year 159/106.  Subsequent blood pressure at PCP office was 132/70.  No therapy was initiated and patient enrolled in remote monitoring program.  Blood pressure was normal during the visit with Dr. Diona Browner.  An echocardiogram was ordered.  14-day Zio patch was ordered for intermittent tachyarrhythmias.  Past Medical History:  Diagnosis Date   Cardiac murmur    Depression    Diabetes, gestational    Heart murmur    Hypertension    Low iron    Rheumatoid arthritis (HCC)    Syncopal episodes     Past Surgical History:  Procedure Laterality Date   KNEE ARTHROPLASTY Right     Current Outpatient Medications  Medication Sig Dispense Refill   DULoxetine (CYMBALTA) 30 MG capsule Take 30 mg by mouth daily.     ergocalciferol (VITAMIN D2)  1.25 MG (50000 UT) capsule Take 50,000 Units by mouth once a week.     folic acid (FOLVITE) 1 MG tablet Take 2 tablets (2 mg total) by mouth daily. 180 tablet 3   HUMIRA PEN 40 MG/0.4ML PNKT INJECT 40 MG INTO THE SKIN EVERY 14 (FOURTEEN) DAYS. 3 each 0   lisinopril (ZESTRIL) 10 MG tablet Take 10 mg by mouth daily.     methotrexate 50 MG/2ML injection Inject 0.7 mLs (17.5 mg total) into the skin once a week. 9 mL 0   TUBERCULIN SYR 1CC/27GX1/2" (B-D TB SYRINGE 1CC/27GX1/2") 27G X 1/2" 1 ML MISC 12 Syringes by Does not apply route once a week. 12 each 3   No current facility-administered medications for this visit.   Allergies:  Ibuprofen   Social History: The patient  reports that she has been smoking cigarettes. She has a 14.25 pack-year smoking history. She has never used smokeless tobacco. She reports current alcohol use of about 6.0 standard drinks per week. She reports that she does not currently use drugs.   Family History: The patient's family history includes Arthritis in an other family member; Asthma in an other family member; Cancer in an other family member; Diabetes in her father and another family member; [name] in her mother; Heart disease in her father and another family member; Seizures in her son.   ROS:  Please see the history of present illness. Otherwise, complete review of systems is positive for none.  All other systems are reviewed and negative.   Physical Exam: VS:  There  were no vitals taken for this visit., BMI There is no height or weight on file to calculate BMI.  Wt Readings from Last 3 Encounters:  10/16/19 180 lb 3.2 oz (81.7 kg)  10/01/19 179 lb 6.4 oz (81.4 kg)  07/18/19 178 lb 9.6 oz (81 kg)    General: Patient appears comfortable at rest. HEENT: Conjunctiva and lids normal, oropharynx clear with moist mucosa. Neck: Supple, no elevated JVP or carotid bruits, no thyromegaly. Lungs: Clear to auscultation, nonlabored breathing at rest. Cardiac:  Regular rate and rhythm, no S3 or significant systolic murmur, no pericardial rub. Abdomen: Soft, nontender, no hepatomegaly, bowel sounds present, no guarding or rebound. Extremities: No pitting edema, distal pulses 2+. Skin: Warm and dry. Musculoskeletal: No kyphosis. Neuropsychiatric: Alert and oriented x3, affect grossly appropriate.  ECG:  {EKG/Telemetry Strips [phone]}  Recent Labwork: No results found for requested labs within last 8760 hours.  No results found for: CHOL, TRIG, HDL, CHOLHDL, VLDL, LDLCALC, LDLDIRECT  Other Studies Reviewed Today:   Assessment and Plan:  1. Tobacco abuse   2. Chest pain, unspecified type   3. Elevated blood pressure reading without diagnosis of hypertension      Medication Adjustments/Labs and Tests Ordered: Current medicines are reviewed at length with the patient today.  Concerns regarding medicines are outlined above.   Disposition: Follow-up with ***  Signed, Rennis Harding, NP 09/23/2021 4:50 PM    [facility] [address] [place], [place], [place] [address] Phone: [phone]; Fax: [phone]

## 2021-09-24 ENCOUNTER — Ambulatory Visit: Payer: Self-pay | Admitting: Family Medicine

## 2021-09-24 DIAGNOSIS — R030 Elevated blood-pressure reading, without diagnosis of hypertension: Secondary | ICD-10-CM

## 2021-09-24 DIAGNOSIS — R079 Chest pain, unspecified: Secondary | ICD-10-CM

## 2021-09-24 DIAGNOSIS — Z720 Tobacco use: Secondary | ICD-10-CM

## 2021-09-29 NOTE — Progress Notes (Deleted)
Office Visit Note  Patient: Cindy Henderson             Date of Birth: 01-20-1981           MRN: [id number]             PCP: Lawerance Sabal, PA Referring: Lawerance Sabal, [place] Visit Date: 10/13/2021 Occupation: @GUAROCC @  Subjective:  No chief complaint on file.   History of Present Illness: Cindy Henderson is a 40 y.o. female ***   Activities of Daily Living:  Patient reports morning stiffness for *** {minute/hour:19697}.   Patient {ACTIONS;DENIES/REPORTS:21021675::"Denies"} nocturnal pain.  Difficulty dressing/grooming: {ACTIONS;DENIES/REPORTS:21021675::"Denies"} Difficulty climbing stairs: {ACTIONS;DENIES/REPORTS:21021675::"Denies"} Difficulty getting out of chair: {ACTIONS;DENIES/REPORTS:21021675::"Denies"} Difficulty using hands for taps, buttons, cutlery, and/or writing: {ACTIONS;DENIES/REPORTS:21021675::"Denies"}  No Rheumatology ROS completed.   PMFS History:  Patient Active Problem List   Diagnosis Date Noted   Smoker 08/15/2018   Poor dentition 08/15/2018   Vitamin D deficiency 08/15/2018   Rheumatoid arthritis involving multiple sites with positive rheumatoid factor (HCC) 08/15/2018   Primary osteoarthritis/ rheumatoid arthritis of both hands 08/15/2018   Primary osteoarthritis/ rheumatoid arthritis of both feet 08/15/2018   Heart murmur 08/15/2018   Patellar instability of right knee 03/21/2012    Past Medical History:  Diagnosis Date   Cardiac murmur    Depression    Diabetes, gestational    Heart murmur    Hypertension    Low iron    Rheumatoid arthritis (HCC)    Syncopal episodes     Family History  Problem Relation Age of Onset   Heart disease Other    Arthritis Other    Cancer Other    Asthma Other    Diabetes Other    Dontell Mian' disease Mother    Heart disease Father    Diabetes Father    Seizures Son    Past Surgical History:  Procedure Laterality Date   KNEE ARTHROPLASTY Right    Social History   Social History Narrative   Not on file     There is no immunization history on file for this patient.   Objective: Vital Signs: There were no vitals taken for this visit.   Physical Exam   Musculoskeletal Exam: ***  CDAI Exam: CDAI Score: -- Patient Global: --; Provider Global: -- Swollen: --; Tender: -- Joint Exam 10/13/2021   No joint exam has been documented for this visit   There is currently no information documented on the homunculus. Go to the Rheumatology activity and complete the homunculus joint exam.  Investigation: No additional findings.  Imaging: No results found.  Recent Labs: Lab Results  Component Value Date   WBC 6.3 10/16/2019   HGB 14.3 10/16/2019   PLT 236 10/16/2019   NA 141 10/16/2019   K 5.0 10/16/2019   CL 106 10/16/2019   CO2 27 10/16/2019   GLUCOSE 80 10/16/2019   BUN 13 10/16/2019   CREATININE 0.84 10/16/2019   BILITOT 0.3 10/16/2019   AST 27 10/16/2019   ALT 24 10/16/2019   PROT 7.0 10/16/2019   CALCIUM 9.4 10/16/2019   GFRAA 104 10/16/2019   QFTBGOLDPLUS NEGATIVE 07/18/2019    Speciality Comments: No specialty comments available.  Procedures:  No procedures performed Allergies: Ibuprofen   Assessment / Plan:     Visit Diagnoses: No diagnosis found.  Orders: No orders of the defined types were placed in this encounter.  No orders of the defined types were placed in this encounter.   Face-to-face time spent with patient  was *** minutes. Greater than 50% of time was spent in counseling and coordination of care.  Follow-Up Instructions: No follow-ups on file.   Earnestine Mealing, CMA  Note - This record has been created using Editor, commissioning.  Chart creation errors have been sought, but may not always  have been located. Such creation errors do not reflect on  the standard of medical care.

## 2021-09-30 NOTE — Progress Notes (Addendum)
Cardiology Office Note  Date: 10/01/2021   ID: MATEYA TORTI, DOB 01-Jun-1981, MRN [id number]  PCP:  Lawerance Sabal, PA  Cardiologist:  Nona Dell, MD Electrophysiologist:  None   Chief Complaint:  has strong family hx of cardiovascular disease, current smoker, has RA and intermittent chest pains. May need stress test. Previously seen in 2020 by Dr Diona Browner -- RECORDS IN [place] //[facility] Special[facility] Services Required-ekg request from pcp 09/23/21  History of Present Illness: Cindy Henderson is a 40 y.o. female with a history of rheumatoid arthritis, primary osteoarthritis, vitamin D deficiency, smoker, heart murmur, hypertension, gestational diabetes, syncopal episodes, low iron, cardiac murmur.  She was last seen by Dr. Diona Browner 10/01/2019 for heart murmur, palpitations, elevated blood pressure without diagnosis of hypertension, rheumatoid arthritis.  She was being seen for heart murmur.  She stated over the prior 6 months she had experienced approximately 6 episodes of feeling a sense of rapid heartbeat and lightheadedness.  She denied ever having syncope.  History of rheumatoid arthritis following with Dr. Corliss Skains.  She had elevated blood pressures in July of that year 159/106.  Subsequent blood pressure at PCP office was 132/70.  No therapy was initiated and patient enrolled in remote monitoring program.  Blood pressure was normal during the visit with Dr. Diona Browner.  An echocardiogram was ordered.  14-day Zio patch was ordered for intermittent tachyarrhythmias.  She is here today referred from her primary care provider for complaints of chest pain/pressure and shortness of breath.  She states she has been having some left-sided chest pain which radiates into her left shoulder and arm which can occur with and without activity.  She states she has diaphoresis when this occurs.  She denies any nausea, vomiting.  She is a current smoker.  She states she has been a long term smoker from a young age and  used to smoke heavily.  Currently smokes 1/2 pack or less daily.  She does have some DOE when performing more than typical ADLs.  States she has some occasional swelling in her lower extremities.  Currently denies any sensation of palpitations or arrhythmias.  She denies any PND or orthopnea.  She does have rheumatoid arthritis.  She states she has significant family history of early heart disease.  Past Medical History:  Diagnosis Date   Cardiac murmur    Depression    Diabetes, gestational    Heart murmur    Hypertension    Low iron    Rheumatoid arthritis (HCC)    Syncopal episodes     Past Surgical History:  Procedure Laterality Date   KNEE ARTHROPLASTY Right     Current Outpatient Medications  Medication Sig Dispense Refill   DULoxetine (CYMBALTA) 30 MG capsule Take 30 mg by mouth daily. (Patient not taking: Reported on 10/01/2021)     ergocalciferol (VITAMIN D2) 1.25 MG (50000 UT) capsule Take 50,000 Units by mouth once a week. (Patient not taking: Reported on 10/01/2021)     folic acid (FOLVITE) 1 MG tablet Take 2 tablets (2 mg total) by mouth daily. (Patient not taking: Reported on 10/01/2021) 180 tablet 3   HUMIRA PEN 40 MG/0.4ML PNKT INJECT 40 MG INTO THE SKIN EVERY 14 (FOURTEEN) DAYS. (Patient not taking: Reported on 10/01/2021) 3 each 0   lisinopril (ZESTRIL) 10 MG tablet Take 10 mg by mouth daily. (Patient not taking: Reported on 10/01/2021)     methotrexate 50 MG/2ML injection Inject 0.7 mLs (17.5 mg total) into the skin once a  week. (Patient not taking: Reported on 10/01/2021) 9 mL 0   TUBERCULIN SYR 1CC/27GX1/2" (B-D TB SYRINGE 1CC/27GX1/2") 27G X 1/2" 1 ML MISC 12 Syringes by Does not apply route once a week. (Patient not taking: Reported on 10/01/2021) 12 each 3   No current facility-administered medications for this visit.   Allergies:  Ibuprofen   Social History: The patient  reports that she has been smoking cigarettes. She has a 14.25 pack-year smoking history. She has  never used smokeless tobacco. She reports current alcohol use of about 6.0 standard drinks per week. She reports that she does not currently use drugs.   Family History: The patient's family history includes Arthritis in an other family member; Asthma in an other family member; Cancer in an other family member; Diabetes in her father and another family member; [name] in her mother; Heart disease in her father and another family member; Seizures in her son.   ROS:  Please see the history of present illness. Otherwise, complete review of systems is positive for none.  All other systems are reviewed and negative.   Physical Exam: VS:  BP 110/70   Pulse 70   Ht 5\' 7"  (1.702 m)   Wt 171 lb 9.6 oz (77.8 kg)   SpO2 98%   BMI 26.88 kg/m , BMI Body mass index is 26.88 kg/m.  Wt Readings from Last 3 Encounters:  10/01/21 171 lb 9.6 oz (77.8 kg)  10/16/19 180 lb 3.2 oz (81.7 kg)  10/01/19 179 lb 6.4 oz (81.4 kg)    General: Patient appears comfortable at rest. Neck: Supple, no elevated JVP or carotid bruits, no thyromegaly. Lungs: Clear to auscultation, nonlabored breathing at rest. Cardiac: Regular rate and rhythm, no S3 or significant systolic murmur, no pericardial rub. Extremities: No pitting edema, distal pulses 2+. Skin: Warm and dry. Musculoskeletal: No kyphosis. Neuropsychiatric: Alert and oriented x3, affect grossly appropriate.  ECG: Recently complaining of left-sided chest pain  Recent Labwork: No results found for requested labs within last 8760 hours.  No results found for: CHOL, TRIG, HDL, CHOLHDL, VLDL, LDLCALC, LDLDIRECT  Other Studies Reviewed Today:  Cardiac monitor 10/14/2019 Zio patch reviewed, 9 days 1 hour analyzed.  Predominant rhythm is sinus.  Heart rate ranged from 53 bpm up to 153 bpm with average heart rate 87 bpm.  Rare PACs and PVCs were noted both representing less than 1% total beats.  There were no significant arrhythmias or pauses.  Assessment  and Plan:  1. Chest pain of uncertain etiology   2. Tobacco abuse   3. Essential hypertension   4. SOB (shortness of breath)     1. Chest pain of uncertain etiology Recently complaining of left-sided chest pain radiating to left shoulder and left arm.  Also complains of associated diaphoresis.  Denies any nausea or vomiting when this occurs.  She states she has a significant family history of early heart disease in her father who had MI before age 5.  She states her primary care provider referred her for symptoms.  Please get a Lexiscan stress test.  Patient is unable to walk due to multiple knee surgeries  2. Tobacco abuse Current half pack per day smoker.  States she has smoked for a long time.  She used to be a heavy smoker.  She states she is sure she has some element of COPD.  She continues to smoke.  Encouraged cessation.  3. Essential hypertension Blood pressure well controlled today.  BP 110/70.  Continue  lisinopril 10 mg daily.  4.  Shortness of breath Complaining of increasing shortness of breath.  She states she has smoked for a long time and is unsure of shortness of breath is related to smoking or from issues from her heart.  Please get an echocardiogram to assess LV function, diastolic function and valvular function.  Medication Adjustments/Labs and Tests Ordered: Current medicines are reviewed at length with the patient today.  Concerns regarding medicines are outlined above.   Disposition: Follow-up with Dr. Diona Browner or APP 6 months  Signed, Rennis Harding, NP 10/01/2021 3:40 PM    [facility] [address] [place], [place], [place] [address] Phone: [phone]; Fax: [phone]

## 2021-10-01 ENCOUNTER — Encounter: Payer: Self-pay | Admitting: Family Medicine

## 2021-10-01 ENCOUNTER — Encounter: Payer: Self-pay | Admitting: *Deleted

## 2021-10-01 ENCOUNTER — Ambulatory Visit (INDEPENDENT_AMBULATORY_CARE_PROVIDER_SITE_OTHER): Payer: Medicaid Other | Admitting: Family Medicine

## 2021-10-01 DIAGNOSIS — R0602 Shortness of breath: Secondary | ICD-10-CM | POA: Diagnosis not present

## 2021-10-01 DIAGNOSIS — R002 Palpitations: Secondary | ICD-10-CM

## 2021-10-01 DIAGNOSIS — I10 Essential (primary) hypertension: Secondary | ICD-10-CM

## 2021-10-01 DIAGNOSIS — Z720 Tobacco use: Secondary | ICD-10-CM | POA: Diagnosis not present

## 2021-10-01 DIAGNOSIS — R079 Chest pain, unspecified: Secondary | ICD-10-CM | POA: Diagnosis not present

## 2021-10-01 NOTE — Patient Instructions (Signed)
Medication Instructions:  Your physician recommends that you continue on your current medications as directed. Please refer to the Current Medication list given to you today.  Labwork: none  Testing/Procedures: Your physician has requested that you have a lexiscan myoview. For further information please visit [email or web address] Please follow instruction sheet, as given. Your physician has requested that you have an echocardiogram. Echocardiography is a painless test that uses sound waves to create images of your heart. It provides your doctor with information about the size and shape of your heart and how well your heart's chambers and valves are working. This procedure takes approximately one hour. There are no restrictions for this procedure.  Follow-Up: Your physician recommends that you schedule a follow-up appointment in: 2-3 months  Any Other Special Instructions Will Be Listed Below (If Applicable).  If you need a refill on your cardiac medications before your next appointment, please call your pharmacy.

## 2021-10-08 ENCOUNTER — Encounter (HOSPITAL_COMMUNITY): Payer: Medicaid Other

## 2021-10-13 ENCOUNTER — Ambulatory Visit: Payer: Self-pay | Admitting: Rheumatology

## 2021-10-13 DIAGNOSIS — K089 Disorder of teeth and supporting structures, unspecified: Secondary | ICD-10-CM

## 2021-10-13 DIAGNOSIS — M19041 Primary osteoarthritis, right hand: Secondary | ICD-10-CM

## 2021-10-13 DIAGNOSIS — Z79899 Other long term (current) drug therapy: Secondary | ICD-10-CM

## 2021-10-13 DIAGNOSIS — F17200 Nicotine dependence, unspecified, uncomplicated: Secondary | ICD-10-CM

## 2021-10-13 DIAGNOSIS — M19071 Primary osteoarthritis, right ankle and foot: Secondary | ICD-10-CM

## 2021-10-13 DIAGNOSIS — M0579 Rheumatoid arthritis with rheumatoid factor of multiple sites without organ or systems involvement: Secondary | ICD-10-CM

## 2021-10-13 DIAGNOSIS — R011 Cardiac murmur, unspecified: Secondary | ICD-10-CM

## 2021-10-13 DIAGNOSIS — E559 Vitamin D deficiency, unspecified: Secondary | ICD-10-CM

## 2021-10-19 ENCOUNTER — Encounter (HOSPITAL_COMMUNITY): Payer: Medicaid Other

## 2021-10-25 NOTE — Progress Notes (Deleted)
Office Visit Note  Patient: Cindy Henderson             Date of Birth: 1981/01/07           MRN: [id number]             PCP: Lawerance Sabal, PA Referring: Lawerance Sabal, [place] Visit Date: 10/26/2021 Occupation: @GUAROCC @  Subjective:    History of Present Illness: IYANI Henderson is a 40 y.o. female with history of seropositive rheumatoid arthritis and osteoarthritis.  Patient presents today after losing follow up since October 2020.  She was previously on Humira and MTX.    Humira on hold since July 2021    Activities of Daily Living:  Patient reports morning stiffness for *** {minute/hour:19697}.   Patient {ACTIONS;DENIES/REPORTS:21021675::"Denies"} nocturnal pain.  Difficulty dressing/grooming: {ACTIONS;DENIES/REPORTS:21021675::"Denies"} Difficulty climbing stairs: {ACTIONS;DENIES/REPORTS:21021675::"Denies"} Difficulty getting out of chair: {ACTIONS;DENIES/REPORTS:21021675::"Denies"} Difficulty using hands for taps, buttons, cutlery, and/or writing: {ACTIONS;DENIES/REPORTS:21021675::"Denies"}  No Rheumatology ROS completed.   PMFS History:  Patient Active Problem List   Diagnosis Date Noted   Smoker 08/15/2018   Poor dentition 08/15/2018   Vitamin D deficiency 08/15/2018   Rheumatoid arthritis involving multiple sites with positive rheumatoid factor (HCC) 08/15/2018   Primary osteoarthritis/ rheumatoid arthritis of both hands 08/15/2018   Primary osteoarthritis/ rheumatoid arthritis of both feet 08/15/2018   Heart murmur 08/15/2018   Patellar instability of right knee 03/21/2012    Past Medical History:  Diagnosis Date   Cardiac murmur    Depression    Diabetes, gestational    Heart murmur    Hypertension    Low iron    Rheumatoid arthritis (HCC)    Syncopal episodes     Family History  Problem Relation Age of Onset   Heart disease Other    Arthritis Other    Cancer Other    Asthma Other    Diabetes Other    Graves' disease Mother    Heart disease Father     Diabetes Father    Seizures Son    Past Surgical History:  Procedure Laterality Date   KNEE ARTHROPLASTY Right    Social History   Social History Narrative   Not on file    There is no immunization history on file for this patient.   Objective: Vital Signs: There were no vitals taken for this visit.   Physical Exam Vitals and nursing note reviewed.  Constitutional:      Appearance: She is well-developed.  HENT:     Head: Normocephalic and atraumatic.  Eyes:     Conjunctiva/sclera: Conjunctivae normal.  Pulmonary:     Effort: Pulmonary effort is normal.  Abdominal:     Palpations: Abdomen is soft.  Musculoskeletal:     Cervical back: Normal range of motion.  Skin:    General: Skin is warm and dry.     Capillary Refill: Capillary refill takes less than 2 seconds.  Neurological:     Mental Status: She is alert and oriented to person, place, and time.  Psychiatric:        Behavior: Behavior normal.     Musculoskeletal Exam: ***  CDAI Exam: CDAI Score: -- Patient Global: --; Provider Global: -- Swollen: --; Tender: -- Joint Exam 10/26/2021   No joint exam has been documented for this visit   There is currently no information documented on the homunculus. Go to the Rheumatology activity and complete the homunculus joint exam.  Investigation: No additional findings.  Imaging: No results found.  Recent  Labs: Lab Results  Component Value Date   WBC 6.3 10/16/2019   HGB 14.3 10/16/2019   PLT 236 10/16/2019   NA 141 10/16/2019   K 5.0 10/16/2019   CL 106 10/16/2019   CO2 27 10/16/2019   GLUCOSE 80 10/16/2019   BUN 13 10/16/2019   CREATININE 0.84 10/16/2019   BILITOT 0.3 10/16/2019   AST 27 10/16/2019   ALT 24 10/16/2019   PROT 7.0 10/16/2019   CALCIUM 9.4 10/16/2019   GFRAA 104 10/16/2019   QFTBGOLDPLUS NEGATIVE 07/18/2019    Speciality Comments: No specialty comments available.  Procedures:  No procedures performed Allergies: Ibuprofen    Assessment / Plan:     Visit Diagnoses: Rheumatoid arthritis involving multiple sites with positive rheumatoid factor ([place])  Primary osteoarthritis/ rheumatoid arthritis of both hands  Primary osteoarthritis/ rheumatoid arthritis of both feet  Patellar instability of right knee  Vitamin D deficiency  Heart murmur  Smoker  Poor dentition  Orders: No orders of the defined types were placed in this encounter.  No orders of the defined types were placed in this encounter.  Follow-Up Instructions: No follow-ups on file.   Cindy Neas, PA-C  Note - This record has been created using Dragon software.  Chart creation errors have been sought, but may not always  have been located. Such creation errors do not reflect on  the standard of medical care.

## 2021-10-26 ENCOUNTER — Ambulatory Visit: Payer: Self-pay | Admitting: Physician Assistant

## 2021-10-26 DIAGNOSIS — K089 Disorder of teeth and supporting structures, unspecified: Secondary | ICD-10-CM

## 2021-10-26 DIAGNOSIS — E559 Vitamin D deficiency, unspecified: Secondary | ICD-10-CM

## 2021-10-26 DIAGNOSIS — M25361 Other instability, right knee: Secondary | ICD-10-CM

## 2021-10-26 DIAGNOSIS — F17200 Nicotine dependence, unspecified, uncomplicated: Secondary | ICD-10-CM

## 2021-10-26 DIAGNOSIS — R011 Cardiac murmur, unspecified: Secondary | ICD-10-CM

## 2021-10-26 DIAGNOSIS — M0579 Rheumatoid arthritis with rheumatoid factor of multiple sites without organ or systems involvement: Secondary | ICD-10-CM

## 2021-10-26 DIAGNOSIS — M19071 Primary osteoarthritis, right ankle and foot: Secondary | ICD-10-CM

## 2021-10-26 DIAGNOSIS — M19042 Primary osteoarthritis, left hand: Secondary | ICD-10-CM

## 2021-11-08 ENCOUNTER — Other Ambulatory Visit: Payer: Self-pay

## 2021-11-08 ENCOUNTER — Ambulatory Visit (HOSPITAL_COMMUNITY)
Admission: RE | Admit: 2021-11-08 | Discharge: 2021-11-08 | Disposition: A | Discharge status: Home / Self Care | Payer: Medicaid Other | Source: Ambulatory Visit | Attending: Family Medicine | Admitting: Family Medicine

## 2021-11-08 DIAGNOSIS — R0602 Shortness of breath: Secondary | ICD-10-CM | POA: Insufficient documentation

## 2021-11-08 DIAGNOSIS — R079 Chest pain, unspecified: Secondary | ICD-10-CM | POA: Diagnosis present

## 2021-11-08 LAB — ECHOCARDIOGRAM COMPLETE
Area-P 1/2: 2.87 cm2
S' Lateral: 3 cm
Single Plane A4C EF: 69 %

## 2021-11-08 NOTE — Progress Notes (Signed)
  Echocardiogram 2D Echocardiogram has been performed.  Cindy Henderson 11/08/2021, 12:00 PM

## 2021-11-17 ENCOUNTER — Ambulatory Visit: Payer: Medicaid Other | Admitting: Family Medicine

## 2021-11-25 ENCOUNTER — Encounter: Payer: Self-pay | Admitting: *Deleted

## 2021-11-25 ENCOUNTER — Telehealth: Payer: Self-pay | Admitting: Rheumatology

## 2021-11-25 NOTE — Telephone Encounter (Signed)
Patient is calling in reference to missed appointment. Patient had to cancel appointment due to her father passing away. Patient's PCP advised patient to call to let us know why she had to cancel appointment, and see if doctor Dr. Corliss Skains would be willing to let patient come in due to circumstance surrounding last canceled appointment. Please call to advise.

## 2021-11-25 NOTE — Telephone Encounter (Signed)
We had multiple cancellations and no-shows.  We would not be able to accept the patient back.

## 2021-11-25 NOTE — Telephone Encounter (Signed)
Spoke with patient and advised that we would be unable to reschedule her appointment or continue to see her in the office due to the multiple cancellations and no shows. Letter mailed to patient to advise as well.

## 2021-11-30 ENCOUNTER — Ambulatory Visit (HOSPITAL_COMMUNITY)
Admission: RE | Admit: 2021-11-30 | Discharge: 2021-11-30 | Disposition: A | Discharge status: Home / Self Care | Payer: Medicaid Other | Source: Ambulatory Visit | Attending: Family Medicine | Admitting: Family Medicine

## 2021-11-30 ENCOUNTER — Other Ambulatory Visit: Payer: Self-pay

## 2021-11-30 ENCOUNTER — Encounter (HOSPITAL_COMMUNITY)
Admission: RE | Admit: 2021-11-30 | Discharge: 2021-11-30 | Disposition: A | Discharge status: Home / Self Care | Payer: Medicaid Other | Source: Ambulatory Visit | Attending: Family Medicine | Admitting: Family Medicine

## 2021-11-30 DIAGNOSIS — R079 Chest pain, unspecified: Secondary | ICD-10-CM | POA: Insufficient documentation

## 2021-11-30 DIAGNOSIS — R0602 Shortness of breath: Secondary | ICD-10-CM | POA: Diagnosis present

## 2021-11-30 LAB — NM MYOCAR MULTI W/SPECT W/WALL MOTION / EF
LV dias vol: 52 mL (ref 46–106)
LV sys vol: 30 mL
Nuc Stress EF: 43 %
Peak HR: 82 {beats}/min
RATE: 0.6
Rest HR: 57 {beats}/min
Rest Nuclear Isotope Dose: 10.6 mCi
SDS: 1
SRS: 3
SSS: 4
ST Depression (mm): 0 mm
Stress Nuclear Isotope Dose: 30 mCi
TID: 0.94

## 2021-11-30 MED ORDER — TECHNETIUM TC 99M TETROFOSMIN IV KIT
10.0000 | PACK | Freq: Once | INTRAVENOUS | Status: AC | PRN
  Administered 2021-11-30: 10.6 via INTRAVENOUS

## 2021-11-30 MED ORDER — REGADENOSON 0.4 MG/5ML IV SOLN
INTRAVENOUS | Status: AC
  Administered 2021-11-30: 0.4 mg via INTRAVENOUS
  Filled 2021-11-30: qty 5

## 2021-11-30 MED ORDER — SODIUM CHLORIDE FLUSH 0.9 % IV SOLN
INTRAVENOUS | Status: AC
  Administered 2021-11-30: 10 mL via INTRAVENOUS
  Filled 2021-11-30: qty 10

## 2021-11-30 MED ORDER — TECHNETIUM TC 99M TETROFOSMIN IV KIT
30.0000 | PACK | Freq: Once | INTRAVENOUS | Status: AC | PRN
  Administered 2021-11-30: 30 via INTRAVENOUS

## 2021-12-01 ENCOUNTER — Telehealth: Payer: Self-pay | Admitting: *Deleted

## 2021-12-01 NOTE — Telephone Encounter (Signed)
-----   Message from Jonelle Sidle, MD sent at 11/30/2021  4:54 PM EST ----- Results reviewed.  Stress testing ordered by Mr. Vincenza Hews NP following recent office visit.  Myoview is interpreted as intermediate risk based on calculated LVEF of 43%, however LVEF was normal by recent echocardiogram which suggests Myoview calculation is inaccurate.  There is evidence of imaging artifact rather than scar or ischemic territories.  Based on this study, no clear indication to pursue invasive cardiac evaluation, however need to make sure there is office follow-up for further discussion of symptoms.

## 2021-12-01 NOTE — Telephone Encounter (Signed)
Patient informed. Copy sent to PCP °

## 2021-12-06 ENCOUNTER — Ambulatory Visit: Payer: Medicaid Other | Admitting: Family Medicine

## 2021-12-23 NOTE — Progress Notes (Signed)
Cardiology Office Note    Date:  01/03/2022   ID:  Cindy Henderson, DOB 13-Feb-1981, MRN [id number]   PCP:  Lawerance Sabal, PA   [facility]  Cardiologist:  Nona Dell, MD   Advanced Practice Provider:  No care team member to display Electrophysiologist:  None   [id number]   Chief Complaint  Patient presents with   Palpitations    History of Present Illness:  Cindy Henderson is a 40 y.o. female with a history of rheumatoid arthritis, primary osteoarthritis, vitamin D deficiency, smoker, heart murmur, hypertension, gestational diabetes, syncopal episodes, low iron, family history of early CAD.  Patient was seen in 10/2021 with chest pain. Lexiscan intermediate risk b/c of reduced LV function but this was normal on echo. No ischemia. Reviewed by Dr. Diona Browner.  Patient comes in for f/u. The other night her heart was racing and she felt like she was going to pass out. She woke her 38 yr old up to incase she had to call 911 but never passed out. She gets diaphoretic and short of breath. She does deep breathing and it lasts about 5 min and no syncope.occurs at least once a week.  She drinks 4-5 cups sweet tea or pepsi daily. No regular exercise other than playing with her 38 yr old and 38 yrs old. Smokes 1-1 1/2 ppd.     Past Medical History:  Diagnosis Date   Cardiac murmur    Depression    Diabetes, gestational    Heart murmur    Hypertension    Low iron    Rheumatoid arthritis (HCC)    Syncopal episodes     Past Surgical History:  Procedure Laterality Date   KNEE ARTHROPLASTY Right     Current Medications: Current Meds  Medication Sig   nicotine (NICODERM CQ) 14 mg/24hr patch STEP 2: Place 1 patch (14 mg) daily onto arm for 2 weeks   nicotine (NICODERM CQ) 21 mg/24hr patch STEP 1 :Place 1 patch (21 mg)  daily onto arm for 6 weeks   nicotine (NICODERM CQ) 7 mg/24hr patch STEP 3:   Place 1 patch (7 mg) daily onto arm daily for 2 weeks and then Stop      Allergies:   Ibuprofen   Social History   Socioeconomic History   Marital status: Divorced    Spouse name: Not on file   Number of children: Not on file   Years of education: GED   Highest education level: Not on file  Occupational History   Not on file  Tobacco Use   Smoking status: Every Day    Packs/day: 0.75    Years: 19.00    Pack years: 14.25    Types: Cigarettes   Smokeless tobacco: Never  Vaping Use   Vaping Use: Former  Substance and Sexual Activity   Alcohol use: Yes    Alcohol/week: 6.0 standard drinks    Types: 6 Cans of beer per week   Drug use: Not Currently    Comment: 1-2 YEARS USE OF MARIJUANA   Sexual activity: Not on file  Other Topics Concern   Not on file  Social History Narrative   Not on file   Social Determinants of Health   Financial Resource Strain: Not on file  Food Insecurity: Not on file  Transportation Needs: Not on file  Physical Activity: Not on file  Stress: Not on file  Social Connections: Not on file     Family History:  The patient's  family history includes Arthritis in an other family member; Asthma in an other family member; Cancer in an other family member; Diabetes in her father and another family member; [name] in her mother; Heart disease in her father and another family member; Seizures in her son.   ROS:   Please see the history of present illness.    ROS All other systems reviewed and are negative.   PHYSICAL EXAM:   VS:  BP 116/70    Pulse 80    Ht 5\' 7"  (1.702 m)    Wt 166 lb (75.3 kg)    SpO2 97%    BMI 26.00 kg/m   Physical Exam  GEN: Well nourished, well developed, in no acute distress  Neck: no JVD, carotid bruits, or masses Cardiac:RRR; no murmurs, rubs, or gallops  Respiratory:  clear to auscultation bilaterally, normal work of breathing GI: soft, nontender, nondistended, + BS Ext: without cyanosis, clubbing, or edema, Good distal pulses bilaterally Neuro:  Alert and Oriented x 3 Psych:  euthymic mood, full affect  Wt Readings from Last 3 Encounters:  01/03/22 166 lb (75.3 kg)  10/01/21 171 lb 9.6 oz (77.8 kg)  10/16/19 180 lb 3.2 oz (81.7 kg)      Studies/Labs Reviewed:   EKG:  EKG is not ordered today.     Recent Labs: No results found for requested labs within last 8760 hours.   Lipid Panel No results found for: CHOL, TRIG, HDL, CHOLHDL, VLDL, LDLCALC, LDLDIRECT  Additional studies/ records that were reviewed today include:  NST 11/30/21   The study is intermediate risk. Risk is based on mildly decreased LVEF, these is no evidence of prior infarct or current ischemia. Consider correlating LVEF with echocardiogram.   No ST deviation was noted.   There is a large moderate intensity inferior/inferoseptal defect that is most intense in the resting images with normal wall motion. There is significant adjacent gut radiotracer uptake. Defect is consistent with artifact.   Left ventricular function is abnormal. Global function is mildly reduced. Nuclear stress EF: 43 %. The left ventricular ejection fraction is mildly decreased (45-54%). End diastolic cavity size is normal.  Echo 11/08/21 IMPRESSIONS     1. Left ventricular ejection fraction, by estimation, is 60 to 65%. The  left ventricle has normal function. The left ventricle has no regional  wall motion abnormalities. Left ventricular diastolic parameters were  normal.   2. Right ventricular systolic function is normal. The right ventricular  size is normal. Tricuspid regurgitation signal is inadequate for assessing  PA pressure.   3. The mitral valve is grossly normal. Trivial mitral valve  regurgitation.   4. The aortic valve is tricuspid. Aortic valve regurgitation is not  visualized.   5. The inferior vena cava is normal in size with greater than 50%  respiratory variability, suggesting right atrial pressure of 3 mmHg.   Comparison(s): No prior Echocardiogram.     Risk Assessment/Calculations:          ASSESSMENT:    1. Chest pain, unspecified type   2. Family history of early CAD   3. Essential hypertension   4. Tobacco abuse   5. Palpitations      PLAN:  In order of problems listed above:  Palpitations heart rate races and last about 5 minutes associated with dyspnea and diaphoresis.  Occurs about once a week.  Monitor in 2020 was benign.  She is getting excessive caffeine and sugar.  I have asked  her to cut back on these as well as smoking cessation.  Place monitor.  Chest pain with intermediate risk lexi due to reduced LV function but this was normal on echo. No ischemia.  Continues to have atypical stabbing type chest pain and soreness in her left shoulder but could be arthritis related  Family history of early CAD-father had MI before 50  HTN controlled  Tobacco abuse continues to smoke 1 to 1-1/2 packs daily.  Long discussion on the importance of cessation, especially given her family history.  She is willing to try the nicotine patches.  Advised her she cannot smoke with the patch on.  Shared Decision Making/Informed Consent        Medication Adjustments/Labs and Tests Ordered: Current medicines are reviewed at length with the patient today.  Concerns regarding medicines are outlined above.  Medication changes, Labs and Tests ordered today are listed in the Patient Instructions below. Patient Instructions  Medication Instructions:  Take Nicoderm as instructed starting with step 1 first, then step 2, then step 3    Labwork: Lipids, cbc,cmet,tsh today  Testing/Procedures: ZIO XT- Long Term Monitor Instructions   Your physician has requested you wear your ZIO patch monitor____14___days.   This is a single patch monitor.  [employer] supplies one patch monitor per enrollment.  Additional stickers are not available.   Do not shower for the first 24 hours.  You may shower after the first 24 hours.   Press button if you feel a symptom. You will hear a small  click.  Record Date, Time and Symptom in the Patient Log Book.   When you are ready to remove patch, follow instructions on last 2 pages of Patient Log Book.  Stick patch monitor onto last page of Patient Log Book.   Place Patient Log Book in [address] box.  Use locking tab on box and tape box closed securely.  The [employer] and [employer] has [employer] on it.  Please place in mailbox as soon as possible.  Your physician should have your test results approximately 7 days after the monitor has been mailed back to [facility].   Call [facility] Customer Care at [phone] if you have questions regarding your ZIO XT patch monitor.  Call them immediately if you see an orange light blinking on your monitor.   If your monitor falls off in less than 4 days contact our Monitor department at [phone].  If your monitor becomes loose or falls off after 4 days call [employer] at [phone] for suggestions on securing your monitor.    Follow-Up: After Zio monitor completed  Any Other Special Instructions Will Be Listed Below (If Applicable).    STOP Smoking, reduce caffeine and sugar    Exercise 150 minutes a week   If you need a refill on your cardiac medications before your next appointment, please call your pharmacy.   Elson Clan, PA-C  01/03/2022 12:14 PM    [facility] [address] [place], [place], [place]  [address] Phone: [phone]; Fax: [phone]

## 2022-01-03 ENCOUNTER — Other Ambulatory Visit (HOSPITAL_COMMUNITY)
Admission: RE | Admit: 2022-01-03 | Discharge: 2022-01-03 | Disposition: A | Discharge status: Home / Self Care | Payer: Medicaid Other | Source: Ambulatory Visit | Attending: Physician Assistant | Admitting: Physician Assistant

## 2022-01-03 ENCOUNTER — Encounter: Payer: Self-pay | Admitting: Physician Assistant

## 2022-01-03 ENCOUNTER — Telehealth: Payer: Self-pay

## 2022-01-03 ENCOUNTER — Other Ambulatory Visit: Payer: Self-pay | Admitting: Physician Assistant

## 2022-01-03 ENCOUNTER — Ambulatory Visit (INDEPENDENT_AMBULATORY_CARE_PROVIDER_SITE_OTHER): Payer: Medicaid Other | Admitting: Physician Assistant

## 2022-01-03 ENCOUNTER — Other Ambulatory Visit: Payer: Self-pay

## 2022-01-03 ENCOUNTER — Ambulatory Visit (INDEPENDENT_AMBULATORY_CARE_PROVIDER_SITE_OTHER): Payer: Medicaid Other

## 2022-01-03 VITALS — BP 116/70 | HR 80 | Ht 67.0 in | Wt 166.0 lb

## 2022-01-03 DIAGNOSIS — I10 Essential (primary) hypertension: Secondary | ICD-10-CM | POA: Insufficient documentation

## 2022-01-03 DIAGNOSIS — Z720 Tobacco use: Secondary | ICD-10-CM | POA: Insufficient documentation

## 2022-01-03 DIAGNOSIS — Z8249 Family history of ischemic heart disease and other diseases of the circulatory system: Secondary | ICD-10-CM | POA: Diagnosis not present

## 2022-01-03 DIAGNOSIS — R002 Palpitations: Secondary | ICD-10-CM

## 2022-01-03 DIAGNOSIS — R079 Chest pain, unspecified: Secondary | ICD-10-CM | POA: Diagnosis not present

## 2022-01-03 LAB — CBC
HCT: 43.9 % (ref 36.0–46.0)
Hemoglobin: 14.2 g/dL (ref 12.0–15.0)
MCH: 31.3 pg (ref 26.0–34.0)
MCHC: 32.3 g/dL (ref 30.0–36.0)
MCV: 96.7 fL (ref 80.0–100.0)
Platelets: 336 10*3/uL (ref 150–400)
RBC: 4.54 MIL/uL (ref 3.87–5.11)
RDW: 13.3 % (ref 11.5–15.5)
WBC: 7.6 10*3/uL (ref 4.0–10.5)
nRBC: 0 % (ref 0.0–0.2)

## 2022-01-03 LAB — COMPREHENSIVE METABOLIC PANEL
ALT: 20 U/L (ref 0–44)
AST: 24 U/L (ref 15–41)
Albumin: 4.3 g/dL (ref 3.5–5.0)
Alkaline Phosphatase: 60 U/L (ref 38–126)
Anion gap: 7 (ref 5–15)
BUN: 6 mg/dL (ref 6–20)
CO2: 25 mmol/L (ref 22–32)
Calcium: 9.1 mg/dL (ref 8.9–10.3)
Chloride: 105 mmol/L (ref 98–111)
Creatinine, Ser: 0.79 mg/dL (ref 0.44–1.00)
GFR, Estimated: >60 mL/min (ref >60)
Glucose, Bld: 112 mg/dL — ABNORMAL HIGH (ref 70–99)
Potassium: 3.9 mmol/L (ref 3.5–5.1)
Sodium: 137 mmol/L (ref 135–145)
Total Bilirubin: 0.4 mg/dL (ref 0.3–1.2)
Total Protein: 7.6 g/dL (ref 6.5–8.1)

## 2022-01-03 LAB — LIPID PANEL
Cholesterol: 203 mg/dL — ABNORMAL HIGH (ref 0–200)
HDL: 41 mg/dL (ref >40)
LDL Cholesterol: 128 mg/dL — ABNORMAL HIGH (ref 0–99)
Total CHOL/HDL Ratio: 5 RATIO
Triglycerides: 170 mg/dL — ABNORMAL HIGH (ref <150)
VLDL: 34 mg/dL (ref 0–40)

## 2022-01-03 LAB — TSH: TSH: 1.849 u[IU]/mL (ref 0.350–4.500)

## 2022-01-03 MED ORDER — NICOTINE 7 MG/24HR TD PT24: MEDICATED_PATCH | TRANSDERMAL | 0 refills | Status: AC

## 2022-01-03 MED ORDER — NICOTINE 21 MG/24HR TD PT24: MEDICATED_PATCH | TRANSDERMAL | 2 refills | Status: AC

## 2022-01-03 MED ORDER — NICOTINE 14 MG/24HR TD PT24: MEDICATED_PATCH | TRANSDERMAL | 0 refills | Status: AC

## 2022-01-03 NOTE — Patient Instructions (Signed)
Medication Instructions:  Take Nicoderm as instructed starting with step 1 first, then step 2, then step 3    Labwork: Lipids, cbc,cmet,tsh today  Testing/Procedures: ZIO XT- Long Term Monitor Instructions   Your physician has requested you wear your ZIO patch monitor____14___days.   This is a single patch monitor.  [employer] supplies one patch monitor per enrollment.  Additional stickers are not available.   Do not shower for the first 24 hours.  You may shower after the first 24 hours.   Press button if you feel a symptom. You will hear a small click.  Record Date, Time and Symptom in the Patient Log Book.   When you are ready to remove patch, follow instructions on last 2 pages of Patient Log Book.  Stick patch monitor onto last page of Patient Log Book.   Place Patient Log Book in [place] box.  Use locking tab on box and tape box closed securely.  The [employer] and [employer] has [employer] on it.  Please place in mailbox as soon as possible.  Your physician should have your test results approximately 7 days after the monitor has been mailed back to [facility].   Call [facility] Customer Care at [phone] if you have questions regarding your ZIO XT patch monitor.  Call them immediately if you see an orange light blinking on your monitor.   If your monitor falls off in less than 4 days contact our Monitor department at [phone].  If your monitor becomes loose or falls off after 4 days call [employer] at [phone] for suggestions on securing your monitor.    Follow-Up: After Zio monitor completed  Any Other Special Instructions Will Be Listed Below (If Applicable).    STOP Smoking, reduce caffeine and sugar    Exercise 150 minutes a week   If you need a refill on your cardiac medications before your next appointment, please call your pharmacy.

## 2022-01-03 NOTE — Telephone Encounter (Signed)
-----   Message from Imogene Burn, [place] sent at 01/03/2022  2:42 PM EST ----- Thyroid normal. Cholesterol and triglycerides and glucose high-needs to reduce sugar and saturated fats and processed foods  in diet. Mediterranean diet.

## 2022-01-03 NOTE — Telephone Encounter (Signed)
Pt notified and verbalized understanding.

## 2022-02-21 NOTE — Progress Notes (Deleted)
? ?Cardiology Office Note   ? ?Date:  02/21/2022  ? ?ID:  Cindy Henderson, DOB October 16, 1981, MRN [id number] ? ? ?PCP:  Denny Levy, PA ?  ?[place]  ?Cardiologist:  Rozann Lesches, MD   ?Advanced Practice Provider:  No care team member to display ?Electrophysiologist:  None  ? ?[id number]  ? ?No chief complaint on file. ? ? ?History of Present Illness:  ?Cindy Henderson is a 41 y.o. female with a history of rheumatoid arthritis, primary osteoarthritis, vitamin D deficiency, smoker, heart murmur, hypertension, gestational diabetes, syncopal episodes, low iron, family history of early CAD. ?  ?Patient was seen in 10/2021 with chest pain. Lexiscan intermediate risk b/c of reduced LV function but this was normal on echo. No ischemia. Reviewed by Dr. Domenic Polite. ? ?I saw the patient 01/03/22 with palpitations with near syncope, diaphoresis and dyspnea. Excessive caffeine and smoking which I asked her to decrease. Zio showed rare PAC's, atrial couplets/triplets, rare PVCs less than 1%, brief bigeminy.  ? ? ? ?Past Medical History:  ?Diagnosis Date  ? Cardiac murmur   ? Depression   ? Diabetes, gestational   ? Heart murmur   ? Hypertension   ? Low iron   ? Rheumatoid arthritis ([place])   ? Syncopal episodes   ? ? ?Past Surgical History:  ?Procedure Laterality Date  ? KNEE ARTHROPLASTY Right   ? ? ?Current Medications: ?No outpatient medications have been marked as taking for the 02/28/22 encounter (Appointment) with Imogene Burn, PA-C.  ?  ? ?Allergies:   Ibuprofen  ? ?Social History  ? ?Socioeconomic History  ? Marital status: Divorced  ?  Spouse name: Not on file  ? Number of children: Not on file  ? Years of education: GED  ? Highest education level: Not on file  ?Occupational History  ? Not on file  ?Tobacco Use  ? Smoking status: Every Day  ?  Packs/day: 0.75  ?  Years: 19.00  ?  Pack years: 14.25  ?  Types: Cigarettes  ? Smokeless tobacco: Never  ?Vaping Use  ? Vaping Use: Former  ?Substance and Sexual  Activity  ? Alcohol use: Yes  ?  Alcohol/week: 6.0 standard drinks  ?  Types: 6 Cans of beer per week  ? Drug use: Not Currently  ?  Comment: 1-2 YEARS USE OF MARIJUANA  ? Sexual activity: Not on file  ?Other Topics Concern  ? Not on file  ?Social History Narrative  ? Not on file  ? ?Social Determinants of Health  ? ?Financial Resource Strain: Not on file  ?Food Insecurity: Not on file  ?Transportation Needs: Not on file  ?Physical Activity: Not on file  ?Stress: Not on file  ?Social Connections: Not on file  ?  ? ?Family History:  The patient's ***family history includes Arthritis in an other family member; Asthma in an other family member; Cancer in an other family member; Diabetes in her father and another family member; Berenice Primas' disease in her mother; Heart disease in her father and another family member; Seizures in her son.  ? ?ROS:   ?Please see the history of present illness.    ?ROS All other systems reviewed and are negative. ? ? ?PHYSICAL EXAM:   ?VS:  There were no vitals taken for this visit.  ?Physical Exam  ?GEN: Well nourished, well developed, in no acute distress  ?HEENT: normal  ?Neck: no JVD, carotid bruits, or masses ?Cardiac:RRR; no murmurs, rubs, or gallops  ?  Respiratory:  clear to auscultation bilaterally, normal work of breathing ?GI: soft, nontender, nondistended, + BS ?Ext: without cyanosis, clubbing, or edema, Good distal pulses bilaterally ?MS: no deformity or atrophy  ?Skin: warm and dry, no rash ?Neuro:  Alert and Oriented x 3, Strength and sensation are intact ?Psych: euthymic mood, full affect ? ?Wt Readings from Last 3 Encounters:  ?01/03/22 166 lb (75.3 kg)  ?10/01/21 171 lb 9.6 oz (77.8 kg)  ?10/16/19 180 lb 3.2 oz (81.7 kg)  ?  ? ? ?Studies/Labs Reviewed:  ? ?EKG:  EKG is*** ordered today.  The ekg ordered today demonstrates *** ? ?Recent Labs: ?01/03/2022: ALT 20; BUN 6; Creatinine, Ser 0.79; Hemoglobin 14.2; Platelets 336; Potassium 3.9; Sodium 137; TSH 1.849  ? ?Lipid Panel ?    ?Component Value Date/Time  ? CHOL 203 (H) 01/03/2022 1230  ? TRIG 170 (H) 01/03/2022 1230  ? HDL 41 01/03/2022 1230  ? CHOLHDL 5.0 01/03/2022 1230  ? VLDL 34 01/03/2022 1230  ? LDLCALC 128 (H) 01/03/2022 1230  ? ? ?Additional studies/ records that were reviewed today include:  ?Zio 01/31/22 ?Zio showed rare PAC's, atrial couplets/triplets, rare PVCs less than 1%, brief bigeminy.  ? ?ZIO XT reviewed.  11 days, 5 hours analyzed.  Predominant rhythm is sinus with heart rate ranging from 44 bpm up to 144 bpm and average heart rate 70 bpm. ?  ?There were rare PACs including atrial couplets and triplets representing less than 1% total beats.  There were also rare PVCs including ventricular couplets representing less than 1% total beats.  Brief episode of ventricular bigeminy noted. ?  ?There were no sustained arrhythmias or pauses. ?Zio showed rare PAC's, atrial couplets/triplets, rare PVCs less than 1%, brief bigeminy.  ? ?NST 11/30/21 ?  The study is intermediate risk. Risk is based on mildly decreased LVEF, these is no evidence of prior infarct or current ischemia. Consider correlating LVEF with echocardiogram. ?  No ST deviation was noted. ?  There is a large moderate intensity inferior/inferoseptal defect that is most intense in the resting images with normal wall motion. There is significant adjacent gut radiotracer uptake. Defect is consistent with artifact. ?  Left ventricular function is abnormal. Global function is mildly reduced. Nuclear stress EF: 43 %. The left ventricular ejection fraction is mildly decreased (45-54%). End diastolic cavity size is normal. ?  ?Echo 11/08/21 ?IMPRESSIONS  ? ? ? 1. Left ventricular ejection fraction, by estimation, is 60 to 65%. The  ?left ventricle has normal function. The left ventricle has no regional  ?wall motion abnormalities. Left ventricular diastolic parameters were  ?normal.  ? 2. Right ventricular systolic function is normal. The right ventricular  ?size is normal.  Tricuspid regurgitation signal is inadequate for assessing  ?PA pressure.  ? 3. The mitral valve is grossly normal. Trivial mitral valve  ?regurgitation.  ? 4. The aortic valve is tricuspid. Aortic valve regurgitation is not  ?visualized.  ? 5. The inferior vena cava is normal in size with greater than 50%  ?respiratory variability, suggesting right atrial pressure of 3 mmHg.  ? ?Comparison(s): No prior Echocardiogram.  ? ?  ? ?Risk Assessment/Calculations:   ?{Does this patient have ATRIAL FIBRILLATION?:[phone]} ? ? ? ? ?ASSESSMENT:   ? ?1. Palpitations   ?2. Chest pain, unspecified type   ?3. Essential hypertension   ?4. Tobacco abuse   ?5. Family history of early CAD   ? ? ? ?PLAN:  ?In order of problems listed above: ? ?Palpitations-  Zio showed rare PAC's, atrial couplets/triplets, rare PVCs less than 1%, brief bigeminy.  ? ?Chest pain atypical with intermediate risk lexi due to reduced lV function but normal LVEF on echo. No ischemia. ? ?HTN ? ?Tobacco ? ?Family history of early CAD-father MI before 18. ? ?Shared Decision Making/Informed Consent   ?{Are you ordering a CV Procedure (e.g. stress test, cath, DCCV, TEE, etc)?   Press F2        :[id number]  ? ? ?Medication Adjustments/Labs and Tests Ordered: ?Current medicines are reviewed at length with the patient today.  Concerns regarding medicines are outlined above.  Medication changes, Labs and Tests ordered today are listed in the Patient Instructions below. ?There are no Patient Instructions on file for this visit.  ? ?Signed, ?Ermalinda Barrios, PA-C  ?02/21/2022 12:45 PM    ?[place] ?[address], [place], [place]  [id number] ?Phone: [phone]; Fax: [phone]  ? ? ?

## 2022-02-28 ENCOUNTER — Ambulatory Visit: Payer: Medicaid Other | Admitting: Physician Assistant

## 2022-02-28 ENCOUNTER — Telehealth: Payer: Self-pay | Admitting: Physician Assistant

## 2022-02-28 DIAGNOSIS — R079 Chest pain, unspecified: Secondary | ICD-10-CM

## 2022-02-28 DIAGNOSIS — R002 Palpitations: Secondary | ICD-10-CM

## 2022-02-28 DIAGNOSIS — I10 Essential (primary) hypertension: Secondary | ICD-10-CM

## 2022-02-28 DIAGNOSIS — Z720 Tobacco use: Secondary | ICD-10-CM

## 2022-02-28 DIAGNOSIS — Z8249 Family history of ischemic heart disease and other diseases of the circulatory system: Secondary | ICD-10-CM

## 2022-02-28 NOTE — Telephone Encounter (Signed)
Dyann Kief, PA-C 01/31/2022  2:43 PM EST   ?  ?Monitor showed mostly normal heart rhythm with rare PAC's and PVC's less than 1% of the time.  ?Pt notified and verbalized understanding. Pt had no questions or concerns at this time.  ?

## 2022-02-28 NOTE — Telephone Encounter (Signed)
New Message: ? ? ? ? ?Patient have an appointment today at 1:00. She says she is not going to be avle to come, she has a fever. She wants to know if somebody can call and give her test results please? ?

## 2022-07-12 ENCOUNTER — Encounter: Payer: Self-pay | Admitting: Internal Medicine

## 2022-08-03 ENCOUNTER — Ambulatory Visit: Payer: Medicaid Other | Admitting: Internal Medicine
